# Patient Record
Sex: Female | Born: 1955 | Race: White | Hispanic: No | Marital: Married | Smoking: Former smoker
Health system: Southern US, Community
[De-identification: ages and names within clinical notes are randomized; demographics above are authoritative.]

## PROBLEM LIST (undated history)

## (undated) DIAGNOSIS — E785 Hyperlipidemia, unspecified: Secondary | ICD-10-CM

## (undated) DIAGNOSIS — K219 Gastro-esophageal reflux disease without esophagitis: Secondary | ICD-10-CM

## (undated) HISTORY — PX: ANKLE SURGERY: SHX546

## (undated) HISTORY — DX: Gastro-esophageal reflux disease without esophagitis: K21.9

## (undated) HISTORY — DX: Hyperlipidemia, unspecified: E78.5

---

## 1999-02-12 ENCOUNTER — Encounter: Admission: RE | Admit: 1999-02-12 | Discharge: 1999-02-12 | Payer: Self-pay | Admitting: Family Medicine

## 1999-03-19 ENCOUNTER — Encounter: Admission: RE | Admit: 1999-03-19 | Discharge: 1999-03-19 | Payer: Self-pay | Admitting: Family Medicine

## 1999-05-14 ENCOUNTER — Encounter: Admission: RE | Admit: 1999-05-14 | Discharge: 1999-05-14 | Payer: Self-pay | Admitting: Family Medicine

## 2000-04-12 ENCOUNTER — Encounter: Admission: RE | Admit: 2000-04-12 | Discharge: 2000-04-12 | Payer: Self-pay | Admitting: Family Medicine

## 2000-08-08 ENCOUNTER — Other Ambulatory Visit: Admission: RE | Admit: 2000-08-08 | Discharge: 2000-08-08 | Payer: Self-pay | Admitting: Gynecology

## 2000-10-01 ENCOUNTER — Emergency Department (HOSPITAL_COMMUNITY): Admission: EM | Admit: 2000-10-01 | Discharge: 2000-10-01 | Payer: Self-pay | Admitting: Emergency Medicine

## 2000-12-21 ENCOUNTER — Encounter: Payer: Self-pay | Admitting: *Deleted

## 2000-12-21 ENCOUNTER — Emergency Department (HOSPITAL_COMMUNITY): Admission: EM | Admit: 2000-12-21 | Discharge: 2000-12-21 | Payer: Self-pay | Admitting: Emergency Medicine

## 2000-12-22 ENCOUNTER — Encounter: Admission: RE | Admit: 2000-12-22 | Discharge: 2000-12-22 | Payer: Self-pay | Admitting: Family Medicine

## 2000-12-23 ENCOUNTER — Encounter: Admission: RE | Admit: 2000-12-23 | Discharge: 2000-12-23 | Payer: Self-pay | Admitting: Family Medicine

## 2001-01-05 ENCOUNTER — Encounter: Admission: RE | Admit: 2001-01-05 | Discharge: 2001-01-05 | Payer: Self-pay | Admitting: Family Medicine

## 2001-01-19 ENCOUNTER — Encounter: Admission: RE | Admit: 2001-01-19 | Discharge: 2001-01-19 | Payer: Self-pay | Admitting: Family Medicine

## 2001-01-19 ENCOUNTER — Encounter: Payer: Self-pay | Admitting: Family Medicine

## 2001-01-19 ENCOUNTER — Ambulatory Visit (HOSPITAL_COMMUNITY): Admission: RE | Admit: 2001-01-19 | Discharge: 2001-01-19 | Payer: Self-pay | Admitting: Family Medicine

## 2001-02-09 ENCOUNTER — Encounter: Admission: RE | Admit: 2001-02-09 | Discharge: 2001-02-09 | Payer: Self-pay | Admitting: Family Medicine

## 2001-06-01 ENCOUNTER — Encounter: Admission: RE | Admit: 2001-06-01 | Discharge: 2001-06-01 | Payer: Self-pay | Admitting: Sports Medicine

## 2001-06-06 ENCOUNTER — Encounter: Admission: RE | Admit: 2001-06-06 | Discharge: 2001-06-06 | Payer: Self-pay | Admitting: Family Medicine

## 2001-06-27 ENCOUNTER — Encounter: Admission: RE | Admit: 2001-06-27 | Discharge: 2001-06-27 | Payer: Self-pay | Admitting: Family Medicine

## 2001-10-10 ENCOUNTER — Encounter: Admission: RE | Admit: 2001-10-10 | Discharge: 2001-10-10 | Payer: Self-pay | Admitting: Family Medicine

## 2001-10-10 ENCOUNTER — Encounter: Payer: Self-pay | Admitting: Sports Medicine

## 2001-10-10 ENCOUNTER — Encounter: Admission: RE | Admit: 2001-10-10 | Discharge: 2001-10-10 | Payer: Self-pay | Admitting: Sports Medicine

## 2001-10-26 ENCOUNTER — Encounter: Admission: RE | Admit: 2001-10-26 | Discharge: 2001-10-26 | Payer: Self-pay | Admitting: Family Medicine

## 2002-01-29 ENCOUNTER — Other Ambulatory Visit: Admission: RE | Admit: 2002-01-29 | Discharge: 2002-01-29 | Payer: Self-pay | Admitting: Gynecology

## 2002-01-31 ENCOUNTER — Encounter (INDEPENDENT_AMBULATORY_CARE_PROVIDER_SITE_OTHER): Payer: Self-pay | Admitting: *Deleted

## 2002-01-31 LAB — CONVERTED CEMR LAB

## 2002-02-14 ENCOUNTER — Encounter: Admission: RE | Admit: 2002-02-14 | Discharge: 2002-02-14 | Payer: Self-pay | Admitting: Family Medicine

## 2002-02-15 ENCOUNTER — Encounter: Admission: RE | Admit: 2002-02-15 | Discharge: 2002-02-15 | Payer: Self-pay | Admitting: Family Medicine

## 2002-05-03 HISTORY — PX: UPPER GASTROINTESTINAL ENDOSCOPY: SHX188

## 2002-05-03 HISTORY — PX: COLONOSCOPY: SHX174

## 2002-06-05 ENCOUNTER — Encounter: Admission: RE | Admit: 2002-06-05 | Discharge: 2002-06-05 | Payer: Self-pay | Admitting: Family Medicine

## 2002-11-15 ENCOUNTER — Encounter: Admission: RE | Admit: 2002-11-15 | Discharge: 2002-11-15 | Payer: Self-pay | Admitting: Family Medicine

## 2003-02-01 ENCOUNTER — Encounter: Admission: RE | Admit: 2003-02-01 | Discharge: 2003-02-01 | Payer: Self-pay | Admitting: Family Medicine

## 2003-02-01 ENCOUNTER — Encounter: Admission: RE | Admit: 2003-02-01 | Discharge: 2003-02-01 | Payer: Self-pay | Admitting: Sports Medicine

## 2003-02-01 ENCOUNTER — Encounter: Payer: Self-pay | Admitting: Sports Medicine

## 2003-02-07 ENCOUNTER — Encounter: Admission: RE | Admit: 2003-02-07 | Discharge: 2003-02-07 | Payer: Self-pay | Admitting: Family Medicine

## 2003-04-18 ENCOUNTER — Ambulatory Visit (HOSPITAL_COMMUNITY): Admission: RE | Admit: 2003-04-18 | Discharge: 2003-04-18 | Payer: Self-pay | Admitting: Gastroenterology

## 2003-05-16 ENCOUNTER — Encounter: Admission: RE | Admit: 2003-05-16 | Discharge: 2003-05-16 | Payer: Self-pay | Admitting: Family Medicine

## 2003-11-05 ENCOUNTER — Encounter: Admission: RE | Admit: 2003-11-05 | Discharge: 2003-11-05 | Payer: Self-pay | Admitting: Sports Medicine

## 2004-04-09 ENCOUNTER — Ambulatory Visit: Payer: Self-pay | Admitting: Family Medicine

## 2004-05-18 ENCOUNTER — Inpatient Hospital Stay (HOSPITAL_COMMUNITY): Admission: EM | Admit: 2004-05-18 | Discharge: 2004-05-22 | Payer: Self-pay | Admitting: Emergency Medicine

## 2004-05-20 ENCOUNTER — Ambulatory Visit: Payer: Self-pay | Admitting: Physical Medicine & Rehabilitation

## 2004-10-27 ENCOUNTER — Ambulatory Visit: Payer: Self-pay | Admitting: Family Medicine

## 2005-04-08 ENCOUNTER — Ambulatory Visit: Payer: Self-pay | Admitting: Family Medicine

## 2005-05-03 HISTORY — PX: ABDOMINAL HYSTERECTOMY: SHX81

## 2005-09-09 ENCOUNTER — Ambulatory Visit (HOSPITAL_COMMUNITY): Admission: RE | Admit: 2005-09-09 | Discharge: 2005-09-09 | Payer: Self-pay | Admitting: Family Medicine

## 2005-09-09 ENCOUNTER — Ambulatory Visit: Payer: Self-pay | Admitting: Family Medicine

## 2005-11-11 ENCOUNTER — Ambulatory Visit: Payer: Self-pay | Admitting: Family Medicine

## 2005-12-14 ENCOUNTER — Ambulatory Visit: Payer: Self-pay | Admitting: Sports Medicine

## 2005-12-14 ENCOUNTER — Ambulatory Visit (HOSPITAL_COMMUNITY): Admission: RE | Admit: 2005-12-14 | Discharge: 2005-12-14 | Payer: Self-pay | Admitting: Sports Medicine

## 2005-12-23 ENCOUNTER — Ambulatory Visit: Payer: Self-pay | Admitting: Family Medicine

## 2006-06-30 DIAGNOSIS — K219 Gastro-esophageal reflux disease without esophagitis: Secondary | ICD-10-CM | POA: Insufficient documentation

## 2006-06-30 DIAGNOSIS — G44209 Tension-type headache, unspecified, not intractable: Secondary | ICD-10-CM

## 2006-06-30 DIAGNOSIS — F329 Major depressive disorder, single episode, unspecified: Secondary | ICD-10-CM

## 2006-06-30 DIAGNOSIS — E7800 Pure hypercholesterolemia, unspecified: Secondary | ICD-10-CM | POA: Insufficient documentation

## 2006-07-01 ENCOUNTER — Encounter (INDEPENDENT_AMBULATORY_CARE_PROVIDER_SITE_OTHER): Payer: Self-pay | Admitting: *Deleted

## 2006-08-09 ENCOUNTER — Telehealth: Payer: Self-pay | Admitting: Family Medicine

## 2006-08-18 ENCOUNTER — Ambulatory Visit: Payer: Self-pay | Admitting: Family Medicine

## 2007-02-08 ENCOUNTER — Telehealth: Payer: Self-pay | Admitting: *Deleted

## 2007-02-08 ENCOUNTER — Ambulatory Visit: Payer: Self-pay | Admitting: Family Medicine

## 2007-02-15 ENCOUNTER — Encounter: Payer: Self-pay | Admitting: Family Medicine

## 2007-05-10 ENCOUNTER — Telehealth (INDEPENDENT_AMBULATORY_CARE_PROVIDER_SITE_OTHER): Payer: Self-pay | Admitting: Family Medicine

## 2007-06-05 ENCOUNTER — Ambulatory Visit: Payer: Self-pay | Admitting: Family Medicine

## 2007-10-12 ENCOUNTER — Emergency Department (HOSPITAL_COMMUNITY): Admission: EM | Admit: 2007-10-12 | Discharge: 2007-10-12 | Payer: Self-pay | Admitting: Emergency Medicine

## 2008-03-18 ENCOUNTER — Telehealth: Payer: Self-pay | Admitting: *Deleted

## 2008-06-24 ENCOUNTER — Ambulatory Visit: Payer: Self-pay | Admitting: Family Medicine

## 2008-06-24 LAB — CONVERTED CEMR LAB

## 2008-06-25 ENCOUNTER — Ambulatory Visit: Payer: Self-pay | Admitting: Family Medicine

## 2008-06-25 LAB — CONVERTED CEMR LAB
Alkaline Phosphatase: 109 units/L (ref 39–117)
Glucose, Bld: 105 mg/dL — ABNORMAL HIGH (ref 70–99)
LDL Cholesterol: 117 mg/dL — ABNORMAL HIGH (ref 0–99)
Sodium: 140 meq/L (ref 135–145)
Total Bilirubin: 0.4 mg/dL (ref 0.3–1.2)
Total Protein: 7.5 g/dL (ref 6.0–8.3)
Triglycerides: 99 mg/dL (ref <150)
VLDL: 20 mg/dL (ref 0–40)

## 2008-06-26 ENCOUNTER — Encounter: Payer: Self-pay | Admitting: Family Medicine

## 2009-01-27 ENCOUNTER — Ambulatory Visit: Payer: Self-pay | Admitting: Family Medicine

## 2009-01-27 DIAGNOSIS — M79609 Pain in unspecified limb: Secondary | ICD-10-CM

## 2009-04-02 ENCOUNTER — Telehealth: Payer: Self-pay | Admitting: Family Medicine

## 2009-05-30 ENCOUNTER — Telehealth: Payer: Self-pay | Admitting: Family Medicine

## 2009-06-16 ENCOUNTER — Ambulatory Visit: Payer: Self-pay | Admitting: Family Medicine

## 2010-02-25 ENCOUNTER — Ambulatory Visit: Payer: Self-pay | Admitting: Family Medicine

## 2010-02-25 LAB — CONVERTED CEMR LAB
AST: 27 units/L (ref 0–37)
Albumin: 4.5 g/dL (ref 3.5–5.2)
Alkaline Phosphatase: 97 units/L (ref 39–117)
Bilirubin, Direct: 0.1 mg/dL (ref 0.0–0.3)
HDL: 55 mg/dL (ref >39)
Total Bilirubin: 0.5 mg/dL (ref 0.3–1.2)

## 2010-02-26 ENCOUNTER — Encounter: Payer: Self-pay | Admitting: Family Medicine

## 2010-05-24 ENCOUNTER — Encounter: Payer: Self-pay | Admitting: Orthopedic Surgery

## 2010-06-04 NOTE — Progress Notes (Signed)
Summary: meds prob  Phone Note Call from Patient Call back at [phone]   Caller: Patient Summary of Call: Kristi Evans is not working and is not sure what else she can take Initial call taken by: De Nurse,  May 30, 2009 11:36 AM  Follow-up for Phone Call        returned call, no answer Follow-up by: Gladstone Pih,  May 30, 2009 11:42 AM  Additional Follow-up for Phone Call Additional follow up Details #1::        tried to call again, no answer. Additional Follow-up by: Theresia Lo RN,  May 30, 2009 1:49 PM    Additional Follow-up for Phone Call Additional follow up Details #2::    c/o being sore & achy. she is at work & cannot come today. pain ranged from 5-7/10  assoon as she gets her schedule, she will call for an appt Follow-up by: Golden Circle RN,  June 02, 2009 9:41 AM

## 2010-06-04 NOTE — Letter (Signed)
Summary: Generic Letter  [facility]  [address]   [place], [place] [address]   Phone: [phone]  Fax: [phone]    02/26/2010  Kristi Evans [address] [place], [place]  [address]  Dear Ms. Cupps,  Your blood tests are good. Your LDL cholesterol went up a little from 117 last year to 120 this year but this is still ok.  Everything else was perfect.    Sincerely,   Pearlean Brownie MD  Appended Document: Generic Letter mailed.

## 2010-06-04 NOTE — Assessment & Plan Note (Signed)
Summary: f/u,df   Vital Signs:  Patient profile:   55 year old female Height:      63 inches Weight:      158 pounds BMI:     28.09 BSA:     1.75 Temp:     98.6 degrees F Pulse rate:   89 / minute BP sitting:   133 / 90  Vitals Entered By: Jone Baseman CMA (June 16, 2009 4:36 PM) CC: f/u Is Patient Diabetic? No Pain Assessment Patient in pain? yes     Location: hands Intensity: 5   CC:  f/u.  History of Present Illness: Current Problems:  HAND PAIN (ICD-729.5) still very painful in R hand at base of thumb.  No so much numbness as pain.  Does have tingling feeling when raises hand over head has had this for years with history of Thoracic outlet.  Does not wake her a at night.  Is better with tramadol Does have median nerve distribution tingling in Left hand at PM  TENSION HEADACHE (ICD-307.81)  also seems to happen with migraine symptoms of noise light intolerance.  her current regimen of rare 1/2 percocet and heat pack with amitryptylene prophylaxis is working.  No visual changes or loss of strenght  HYPERCHOLESTEROLEMIA (ICD-272.0) taking zocor without problems.  no chest pain no right upper quadrant pain  GASTROESOPHAGEAL REFLUX, NO ESOPHAGITIS (ICD-530.81) flaring using tums no bleeding or food sticking.  Not on PPI  DEPRESSIVE DISORDER, NOS (ICD-311) stable on amitryptylene  ROS - as above PMH - Medications reviewed and updated in medication list.  Smoking Status noted in VS form      Habits & Providers  Alcohol-Tobacco-Diet     Tobacco Status: never  Current Medications (verified): 1)  Amitriptyline Hcl 150 Mg Tabs (Amitriptyline Hcl) .Marland Kitchen.. 1 By Mouth At Bedtime 2)  Tramadol Hcl 50 Mg  Tabs (Tramadol Hcl) .... One- Two By Mouth Q6h As Needed Pain 3)  Zocor 40 Mg Tabs (Simvastatin) .... Take 1 Tablet By Mouth At Bedtime 4)  Percocet 5-325 Mg  Tabs (Oxycodone-Acetaminophen) .Marland Kitchen.. 1 As Needed For Severe Headaches.  No More Than 1 Daily 5)   Omeprazole 20 Mg Cpdr (Omeprazole) .Marland Kitchen.. 1 Daily For Heart Burn  Allergies: 1)  Codeine Phosphate (Codeine Phosphate) 2)  Aspirin (Aspirin)  Physical Exam  General:  Well-developed,well-nourished,in no acute distress; alert,appropriate and cooperative throughout examination Lungs:  Normal respiratory effort, chest expands symmetrically. Lungs are clear to auscultation, no crackles or wheezes. Heart:  Normal rate and regular rhythm. S1 and S2 normal without gallop, murmur, click, rub or other extra sounds. Extremities:  R hand tender over base of thumb.  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  Will check xray on R.  Consider splint for left  Orders: Diagnostic X-Ray/Fluoroscopy (Diagnostic X-Ray/Flu) [facility]- Est  Level 4 ([address])  Problem # 2:  TENSION HEADACHE (ICD-307.81)  also migraine components.  No red flags.  She is happy with current regimen Her updated medication list for this problem includes:    Tramadol Hcl 50 Mg Tabs (Tramadol hcl) ..... One- two by mouth q6h as needed pain    Percocet 5-325 Mg Tabs (Oxycodone-acetaminophen) .Marland Kitchen... 1 as needed for severe headaches.  no more than  1 daily  Orders: [facility]- Est  Level 4 ([address])  Problem # 3:  GASTROESOPHAGEAL REFLUX, NO ESOPHAGITIS (ICD-530.81) Assessment: Deteriorated No red flags but get back on PPI  Her updated medication list for this problem includes:    Omeprazole 20 Mg Cpdr (Omeprazole) .Marland Kitchen... 1 daily for heart burn  Problem # 4:  HYPERCHOLESTEROLEMIA (ICD-272.0) Assessment: Unchanged  Her updated medication list for this problem includes:    Zocor 40 Mg Tabs (Simvastatin) .Marland Kitchen... Take 1 tablet by mouth at bedtime  Orders: [facility]- Est  Level 4 ([address])Future Orders: Lipid-[facility] ([id number]) ...  06/03/2010 Hepatic-[facility] [id number]) ... 06/03/2010  Labs Reviewed: SGOT: 26 (06/25/2008)   SGPT: 36 (06/25/2008)   HDL:75 (06/25/2008)  LDL:117 (06/25/2008)  Chol:212 (06/25/2008)  Trig:99 (06/25/2008)  Complete Medication List: 1)  Amitriptyline Hcl 150 Mg Tabs (Amitriptyline hcl) .Marland Kitchen.. 1 by mouth at bedtime 2)  Tramadol Hcl 50 Mg Tabs (Tramadol hcl) .... One- two by mouth q6h as needed pain 3)  Zocor 40 Mg Tabs (Simvastatin) .... Take 1 tablet by mouth at bedtime 4)  Percocet 5-325 Mg Tabs (Oxycodone-acetaminophen) .Marland Kitchen.. 1 as needed for severe headaches.  no more than 1 daily 5)  Omeprazole 20 Mg Cpdr (Omeprazole) .Marland Kitchen.. 1 daily for heart burn  Patient Instructions: 1)  Please schedule a follow-up appointment in 6 months .  2)  Use tramadol and ice for pain I will call you with the xray results 3)  Try the omeprazole for hearburn call me if getting severe or any bleeding  4)  Fasting choleserol blood test call the day before 5)  It is important that you exercise reguarly at least 20 minutes 5 times a week. If you develop chest pain, have severe difficulty breathing, or feel very tired, stop exercising immediately and seek medical attention.  6)  Schedule your mammogram.  Prescriptions: ZOCOR 40 MG TABS (SIMVASTATIN) Take 1 tablet by mouth at bedtime  #30 x 11   Entered and Authorized by:   Pearlean Brownie MD   Signed by:   Pearlean Brownie MD on 06/16/2009   Method used:   Electronically to        [facility]  [address] [phone]* (retail)       [address]       [place], [place]  [address]       Ph: [phone] or [phone]       Fax: [phone]   RxID:   [id number] TRAMADOL HCL 50 MG  TABS (TRAMADOL HCL) one- two by mouth q6h as needed pain  #60 x 6   Entered and Authorized by:   Pearlean Brownie MD   Signed by:   Pearlean Brownie MD on 06/16/2009   Method used:   [facility] to        [facility]  [address] [phone]* (retail)        [address]       [place], [place]  [address]       Ph: [phone] or [phone]       Fax: [phone]   RxID:   [id number] OMEPRAZOLE 20 MG CPDR (OMEPRAZOLE) 1 daily for heart burn  #30 x 6   Entered and Authorized by:   Pearlean Brownie MD   Signed by:   Pearlean Brownie MD on 06/16/2009   Method used:   [facility]        [facility]  [address] [phone]* (retail)       [address]       [place], [place]  [address]       Ph: [phone] or [phone]       Fax: [phone]   RxID:   [id number]    Prevention & Chronic Care Immunizations   Influenza vaccine: Not documented    Tetanus booster: Not documented    Pneumococcal vaccine: Not documented  Colorectal Screening   Hemoccult: Not documented   Hemoccult due: Not Indicated    Colonoscopy: normal  (03/04/2003)   Colonoscopy due: 03/03/2013  Other Screening   Pap smear: Hysterectomy in distant past  (06/24/2008)   Pap smear due: Not Indicated    Mammogram: Normal  (02/23/2007)   Mammogram due: 03/2008   Smoking status: never  (06/16/2009)  Lipids   Total Cholesterol: 212  (06/25/2008)   LDL: 117  (06/25/2008)   LDL Direct: Not documented   HDL: 75  (06/25/2008)   Triglycerides: 99  (06/25/2008)    SGOT (AST): 26  (06/25/2008)   SGPT (ALT): 36  (06/25/2008)   Alkaline phosphatase: 109  (06/25/2008)   Total bilirubin: 0.4  (06/25/2008)    Lipid flowsheet reviewed?: Yes   Progress toward LDL goal: Unchanged  Self-Management Support :   Personal Goals (by the next clinic visit) :      Personal LDL goal: 130  (01/27/2009)    Lipid self-management support: Not documented

## 2010-06-04 NOTE — Assessment & Plan Note (Signed)
Summary: cough/dizziness,tcb   Vital Signs:  Patient profile:   55 year old female Height:      63 inches Weight:      160.38 pounds BMI:     28.51 BSA:     1.76 O2 Sat:      98 % on Room air Temp:     98.4 degrees F Pulse rate:   108 / minute BP sitting:   127 / 81  Vitals Entered By: Jone Baseman CMA (February 25, 2010 9:23 AM)  O2 Flow:  Room air CC: cough adn dizziness x 2 weeks Is Patient Diabetic? No Pain Assessment Patient in pain? yes     Location: left side of back Intensity: 6   CC:  cough adn dizziness x 2 weeks.  History of Present Illness: cough for the last few weeks.  Slowly impoving.  Everyone in family had illness.  No fever or chills or sputum.  Has had left sided chest pain with cough or movement started yesterday is better today. No shortness of breath with exertion or leg swelling.   Tried mucinex once  Headaches continue as usual.  No changes.  No focal weakness or vision changes.  Usually contolled with tramadol.  Rarely uses 1/2 of a percocet.  Cholesterol taking zocor daily.  No right upper quadrant pain or muscle complaints.  No chest pain   Hand pain continues unchanged.  Numb more on the left and pain at base of both thumbs.  ROS - as above PMH - Medications reviewed and updated in medication list.  Smoking Status noted in VS form    Habits & Providers  Alcohol-Tobacco-Diet     Tobacco Status: never     Year Quit: 1998  Current Medications (verified): 1)  Amitriptyline Hcl 150 Mg Tabs (Amitriptyline Hcl) .Marland Kitchen.. 1 By Mouth At Bedtime 2)  Tramadol Hcl 50 Mg  Tabs (Tramadol Hcl) .... One- Two By Mouth Q6h As Needed Pain 3)  Zocor 40 Mg Tabs (Simvastatin) .... Take 1 Tablet By Mouth At Bedtime 4)  Percocet 5-325 Mg  Tabs (Oxycodone-Acetaminophen) .Marland Kitchen.. 1 As Needed For Severe Headaches.  No More Than 1 Daily 5)  Omeprazole 20 Mg Cpdr (Omeprazole) .Marland Kitchen.. 1 Daily For Heart Burn  Allergies: 1)  Codeine Phosphate (Codeine Phosphate) 2)   Aspirin (Aspirin)  Physical Exam  General:  Well-developed,well-nourished,in no acute distress; alert,appropriate and cooperative throughout examination Lungs:  Normal respiratory effort, chest expands symmetrically. Lungs are clear to auscultation, no crackles or wheezes. Heart:  Normal rate and regular rhythm. S1 and S2 normal without gallop, murmur, click, rub or other extra sounds. Msk:  Back tender over lateral mid left flank.  No deformity.  No radiation  Hands mild deformity of hand joints diffusely, No actve redness or warmth. Tender a base of both thumbs.   No loss of strength or muscle mass    Impression & Recommendations:  Problem # 1:  COUGH (ICD-786.2) post viral without signs of pneumonia or COPD. Try cough suppressant and follow to resolution  Orders: [facility]- Est  Level 4 ([address])  Problem # 2:  HAND PAIN (ICD-729.5) DJD.   continue analgesics. Also likely has carpal tunnel on the Left with her numbness symptoms. Has previoulsy had release on the R.  To consider steroid injection if is severe   Problem # 3:  HYPERCHOLESTEROLEMIA (ICD-272.0) check labs today  Her updated medication list for this problem includes:    Zocor 40 Mg Tabs (Simvastatin) .Marland Kitchen... Take 1 tablet  by mouth at bedtime  Orders: Lipid-[facility] ([id number]) Hepatic-[facility] [phone]) [facility]- Est  Level 4 ([address])  Labs Reviewed: SGOT: 26 (06/25/2008)   SGPT: 36 (06/25/2008)   HDL:75 (06/25/2008)  LDL:117 (06/25/2008)  Chol:212 (06/25/2008)  Trig:99 (06/25/2008)  Problem # 4:  TENSION HEADACHE (ICD-307.81) Assessment: Unchanged stable with current regimen Her updated medication list for this problem includes:    Tramadol Hcl 50 Mg Tabs (Tramadol hcl) ..... One- two by mouth q6h as needed pain    Percocet 5-325 Mg Tabs (Oxycodone-acetaminophen) .Marland Kitchen... 1 as needed for severe headaches.  no more than 1 daily  Orders: [facility]- Est  Level 4 ([address])  Complete Medication List: 1)  Amitriptyline Hcl 150 Mg Tabs  (Amitriptyline hcl) .Marland Kitchen.. 1 by mouth at bedtime 2)  Tramadol Hcl 50 Mg Tabs (Tramadol hcl) .... One- two by mouth q6h as needed pain 3)  Zocor 40 Mg Tabs (Simvastatin) .... Take 1 tablet by mouth at bedtime 4)  Percocet 5-325 Mg Tabs (Oxycodone-acetaminophen) .Marland Kitchen.. 1 as needed for severe headaches.  no more than 1 daily 5)  Omeprazole 20 Mg Cpdr (Omeprazole) .Marland Kitchen.. 1 daily for heart burn 6)  Tessalon 200 Mg Caps (Benzonatate) .Marland Kitchen.. 1 by mouth three times a day as needed  Other Orders: Flu Vaccine 26yrs + ([address]) Admin 1st Vaccine ([address])  Patient Instructions: 1)  Call if your cough is not gone in 3 weeks or if you get fever or sputum 2)  Schedule your mammogram.  3)  I will call you if your lab is abnormal otherwise I will send you a letter within 2 weeks. Prescriptions: TESSALON 200 MG CAPS (BENZONATATE) 1 by mouth three times a day as needed  #30 x 1   Entered and Authorized by:   Pearlean Brownie MD   Signed by:   Pearlean Brownie MD on 02/25/2010   Method used:   [facility]        [facility] [address] [phone]* (retail)       [address]       [place], [place]  [address]       Ph: [phone] or [phone]       Fax: [phone]   RxID:   [id number] PERCOCET 5-325 MG  TABS (OXYCODONE-ACETAMINOPHEN) 1 as needed for severe headaches.  No more than 1 daily  #300 x 0   Entered and Authorized by:   Pearlean Brownie MD   Signed by:   Pearlean Brownie MD on 02/25/2010   Method used:   Handwritten   RxID:   [id number]    Orders Added: 1)  Lipid-[facility] [[id number]] 2)  Hepatic-[facility] [[id number]] 3)  Flu Vaccine 35yrs + [[address]] 4)  Admin 1st Vaccine [[address]71] 5)  [facility]- Est  Level 4 [[address]]   Immunizations Administered:  Influenza Vaccine # 1:    Vaccine Type: Fluvax 3+    Site: right deltoid    Mfr: GlaxoSmithKline    Dose: 0.25 ml    Route: IM    Given by: Jone Baseman CMA    Exp. Date: 10/28/2010    Lot #: [id number]     VIS given: 11/25/09 version given February 25, 2010.  Flu Vaccine Consent Questions:    Do you have a history of severe allergic reactions to this vaccine? no    Any prior history of allergic reactions to egg and/or gelatin? no    Do you have a sensitivity to the preservative Thimersol? no  Do you have a past history of Guillan-Barre Syndrome? no    Do you currently have an acute febrile illness? no    Have you ever had a severe reaction to latex? no    Vaccine information given and explained to patient? yes    Are you currently pregnant? no   Immunizations Administered:  Influenza Vaccine # 1:    Vaccine Type: Fluvax 3+    Site: right deltoid    Mfr: GlaxoSmithKline    Dose: 0.25 ml    Route: IM    Given by: Jone Baseman CMA    Exp. Date: 10/28/2010    Lot #: [id number]    VIS given: 11/25/09 version given February 25, 2010.   Prevention & Chronic Care Immunizations   Influenza vaccine: Fluvax 3+  (02/25/2010)    Tetanus booster: Not documented    Pneumococcal vaccine: Not documented  Colorectal Screening   Hemoccult: Not documented   Hemoccult due: Not Indicated    Colonoscopy: normal  (03/04/2003)   Colonoscopy due: 03/03/2013  Other Screening   Pap smear: Hysterectomy in distant past  (06/24/2008)   Pap smear due: Not Indicated    Mammogram: Normal  (02/23/2007)   Mammogram due: 03/2008   Smoking status: never  (02/25/2010)  Lipids   Total Cholesterol: 212  (06/25/2008)   LDL: 117  (06/25/2008)   LDL Direct: Not documented   HDL: 75  (06/25/2008)   Triglycerides: 99  (06/25/2008)    SGOT (AST): 26  (06/25/2008)   SGPT (ALT): 36  (06/25/2008)   Alkaline phosphatase: 109  (06/25/2008)   Total bilirubin: 0.4  (06/25/2008)  Self-Management Support :   Personal Goals (by the next clinic visit) :      Personal LDL goal: 130  (01/27/2009)    Lipid self-management support: Not documented

## 2010-06-12 ENCOUNTER — Other Ambulatory Visit: Payer: Self-pay | Admitting: Family Medicine

## 2010-06-12 NOTE — Telephone Encounter (Signed)
Refill request

## 2010-08-04 ENCOUNTER — Other Ambulatory Visit: Payer: Self-pay | Admitting: Family Medicine

## 2010-08-04 DIAGNOSIS — F329 Major depressive disorder, single episode, unspecified: Secondary | ICD-10-CM

## 2010-08-04 NOTE — Telephone Encounter (Signed)
Refill request

## 2010-08-09 ENCOUNTER — Other Ambulatory Visit: Payer: Self-pay | Admitting: Family Medicine

## 2010-08-09 NOTE — Telephone Encounter (Signed)
Refill request

## 2010-08-17 ENCOUNTER — Ambulatory Visit (INDEPENDENT_AMBULATORY_CARE_PROVIDER_SITE_OTHER): Payer: 59 | Admitting: Family Medicine

## 2010-08-17 ENCOUNTER — Encounter: Payer: Self-pay | Admitting: Family Medicine

## 2010-08-17 DIAGNOSIS — M79609 Pain in unspecified limb: Secondary | ICD-10-CM

## 2010-08-17 DIAGNOSIS — E7800 Pure hypercholesterolemia, unspecified: Secondary | ICD-10-CM

## 2010-08-17 DIAGNOSIS — K219 Gastro-esophageal reflux disease without esophagitis: Secondary | ICD-10-CM

## 2010-08-17 DIAGNOSIS — G44209 Tension-type headache, unspecified, not intractable: Secondary | ICD-10-CM

## 2010-08-17 MED ORDER — TRAMADOL HCL 50 MG PO TABS: 50.0000 mg | ORAL_TABLET | Freq: Four times a day (QID) | ORAL | Status: DC | PRN

## 2010-08-17 MED ORDER — OMEPRAZOLE 20 MG PO CPDR: 20.0000 mg | DELAYED_RELEASE_CAPSULE | Freq: Every day | ORAL | Status: DC

## 2010-08-17 NOTE — Progress Notes (Signed)
  Subjective:    Patient ID: Kristi Evans, female    DOB: 07-Apr-1956, 55 y.o.   MRN: [id number]  HPI  GERD continues to have episodes of meat sticking and also episodes of reflux when she will regurgitate a large part of her meals this is been worsened over the last 2 days, but she's had a GI bug. She does not have any weight loss or bleeding. she takes omeprazole daily which does seem to help  Hand pain  continues to have bilateral hand pain pain seems to be mostly in the medial nerve distribution she awakes at night and has to shake her hands to get them to wake up she has not lost any strength does not seem to be dropping things regularly she had an carpal tunnel release on the right a number of years ago.   Elevated cholesterol She is taking on simvastatin daily without any right upper quadrant pain or muscle aches no chest pain with exertion or any claudication symptoms  Tension headaches Continues to have intermittent tension headaches are similar to the one she said in the past she takes tramadol when these are mild or occasional Percocet when they are severe no visual changes or any focal weakness   Past medical history With previous carpal tunnel release surgery she had a good result She saw Dr. Katheren Puller before for her GERD and had an endoscopy a number of years ago  Review of Systems see above    Objective:   Physical Exam   Hands good grip strength bilaterally no muscle weight wasting sensation is grossly intact to light touch Abdomen no masses hepatosplenomegaly guarding or rebound Extremities no edema cyanosis or clubbing no joint deformity    Assessment & Plan:

## 2010-08-17 NOTE — Assessment & Plan Note (Signed)
Consistent with bilateral carpal tunnel syndrome. We discussed possible approaches including injection and referral for carpal tunnel release she will continue to monitor her symptoms and let me know if they are worsening. she will try night splints

## 2010-08-17 NOTE — Assessment & Plan Note (Signed)
Stable with current regimen of when necessary analgesics and amitriptyline

## 2010-08-17 NOTE — Assessment & Plan Note (Signed)
Unsure whether this is slightly worsened or not she has chronic symptoms for years. We decided that she would monitor her symptoms closely for the next several weeks and if she feels they are getting worse meaning more regurgitation or food sticking that she should give me a call and we will refer her for re\ endoscopy with Dr. Jarold Motto.  Currently there are no specific red flag symptoms of bleeding or weight loss or severe dysphagia

## 2010-08-17 NOTE — Assessment & Plan Note (Signed)
Stable on simvastatin encouraged continued increase in fruits and vegetables and exercise

## 2010-08-17 NOTE — Patient Instructions (Signed)
Call me if the reflux food coming back is getting worse Call me if you would like a referral for the carpal tunnel Keep up the fish oil and the exercise Let me know if the masses are getting bigger

## 2010-08-18 ENCOUNTER — Encounter: Payer: Self-pay | Admitting: Family Medicine

## 2010-09-18 NOTE — Discharge Summary (Signed)
NAMERACHELL, DRUCKENMILLER              ACCOUNT NO.:  [id number]   MEDICAL RECORD NO.:  [id number]          PATIENT TYPE:  INP   LOCATION:  5035                         FACILITY:  [facility]   PHYSICIAN:  Burnard Bunting, M.D.    DATE OF BIRTH:  December 01, 1955   DATE OF ADMISSION:  05/19/2004  DATE OF DISCHARGE:  05/22/2004                                 DISCHARGE SUMMARY   PREOPERATIVE DIAGNOSIS:  Left ankle pilon fracture.   POSTOPERATIVE DIAGNOSIS:  Left ankle pilon fracture.   SECONDARY DIAGNOSIS:  None.   PROCEDURE:  Left ankle pilon fracture, open reduction internal fixation  performed May 19, 2004.   HOSPITAL COURSE:  Kristi Evans is a 55 year old patient who sustained a  left ankle pilon and lateral malleolus fracture on May 19, 2004.  She  underwent left ankle open reduction, internal fixation pilon fracture.  Toes  were mobile, perfused, and sensate on postop day #1.  She was mobilized with  physical therapy.  Compartments were soft.  She was started on Coumadin for  DVT prophylaxis.  The patient was slow to mobilized.  It was difficult for  her to maintain a nonweightbearing status.   She was discharged home on May 22, 2004, in good condition.   DISCHARGE MEDICATIONS:  1.  Percocet one to two p.o. q.3-4h. p.r.n. pain.  2.  Robaxin.  3.  Coumadin.   FOLLOWUP:  She will follow up with me in seven days for suture removal.  Continue nonweightbearing status.      GSD/MEDQ  D:  07/10/2004  T:  07/12/2004  Job:  [id number]

## 2010-09-18 NOTE — Op Note (Signed)
NAMEDOLORA, Kristi Evans              ACCOUNT NO.:  [id number]   MEDICAL RECORD NO.:  [id number]          PATIENT TYPE:  INP   LOCATION:  5035                         FACILITY:  [facility]   PHYSICIAN:  Burnard Bunting, M.D.    DATE OF BIRTH:  [birth year]   DATE OF PROCEDURE:  05/19/2004  DATE OF DISCHARGE:                                 OPERATIVE REPORT   PREOPERATIVE DIAGNOSIS:  Left ankle pilon fracture and distal fibular  fracture.   POSTOPERATIVE DIAGNOSIS:  Left ankle pilon fracture and distal fibular  fracture.   PROCEDURE:  Left ankle open reduction/internal fixation of pilon fracture  with manipulation of distal fibular fracture.   SURGEON:  Burnard Bunting, M.D.   ASSISTANT:  Vear Clock,  M.D..   ANESTHESIA:  General endotracheal.   ESTIMATED BLOOD LOSS:  50 cc.   DRAINS:  None.   PROCEDURE IN DETAIL:  Patient was brought to the operating room, where  general endotracheal anesthesia was induced.  Preoperative antibiotics were  administered.  The left ankle was then prepped with Duraprep solution and  draped in a sterile manner.  An ankle esmarch was utilized for approximately  an hour and a half.  Medial incision was made over the medial malleolus.  Skin and subcutaneous tissue were sharply divided.  A large medial malleolar  fragment was exposed.  The periosteum was elevated only from around the  fracture site.  The distal tibial articular surface had been impacted and  depressed on the medial aspect of the plafond.  This articular surface was  exposed and tapped back down to the anatomic position.  After irrigation,  allograft crouton bone grafting was applied to support the articular  surface.  The medial malleolar fracture segment was then reattached to the  tibial shaft using two 4-5 cannulated partially threaded screws and one 4-0  cannulated screw to support the articular reconstruction.  At this time, the  lateral ankle reduction was anatomic.  Manipulation was  performed in order  to make the reduction anatomic.  The reduction was confirmed in the AP and  lateral planes under fluoroscopic guidance.  The medial incision was then  irrigated and closed using interrupted inverted 2-0 Vicryl and 3-0 nylon.  The patient was placed in a bulky posterior splint.  She tolerated the  procedure well without any complications.       GSD/MEDQ  D:  05/19/2004  T:  05/19/2004  Job:  [id number]

## 2011-05-06 ENCOUNTER — Encounter: Payer: Self-pay | Admitting: Family Medicine

## 2011-05-06 ENCOUNTER — Ambulatory Visit (INDEPENDENT_AMBULATORY_CARE_PROVIDER_SITE_OTHER): Payer: 59 | Admitting: Family Medicine

## 2011-05-06 VITALS — BP 136/82 | HR 88 | Temp 98.7°F | Ht 61.0 in | Wt 164.0 lb

## 2011-05-06 DIAGNOSIS — R059 Cough, unspecified: Secondary | ICD-10-CM | POA: Insufficient documentation

## 2011-05-06 DIAGNOSIS — R05 Cough: Secondary | ICD-10-CM

## 2011-05-06 DIAGNOSIS — J069 Acute upper respiratory infection, unspecified: Secondary | ICD-10-CM

## 2011-05-06 MED ORDER — BENZONATATE 100 MG PO CAPS: 100.0000 mg | ORAL_CAPSULE | Freq: Two times a day (BID) | ORAL | Status: AC | PRN

## 2011-05-06 NOTE — Patient Instructions (Signed)
It was nice meeting you today. I think your symptoms are likely due to a virus I would continue the mucinex, drink plenty of fluids, and take the prescribed medication for your cough If not getting better by next week please give our office a call.

## 2011-05-09 DIAGNOSIS — J069 Acute upper respiratory infection, unspecified: Secondary | ICD-10-CM | POA: Insufficient documentation

## 2011-05-09 NOTE — Assessment & Plan Note (Signed)
Symptoms consistent with viral process.  Symptomatic therapy suggested: push fluids, rest and return office visit prn if symptoms persist or worsen. Lack of antibiotic effectiveness discussed with her. Call or return to clinic prn if these symptoms worsen or fail to improve as anticipated.

## 2011-05-09 NOTE — Progress Notes (Signed)
Patient ID: Kristi Evans, female   DOB: 1955-06-26, 56 y.o.   MRN: [id number] SUBJECTIVE:  Kristi Evans is a 56 y.o. female who complains of coryza, congestion and dry cough for 7 days. She denies a history of chills, fevers, myalgias, nausea, shortness of breath, vomiting, wheezing and sputum production and denies a history of asthma. Patient does not smoke cigarettes.  Feels like symptoms are improving some.     OBJECTIVE: Filed Vitals:   05/06/11 0852  BP: 136/82  Pulse: 88  Temp: 98.7 F (37.1 C)    GEN: She appears well, vital signs are as noted.  HEENT: Ears normal.  Throat and pharynx normal.  Neck supple. No adenopathy in the neck. Nose is congested. Sinuses non tender. Pulm: The chest is clear, without wheezes or rales.

## 2011-06-24 ENCOUNTER — Other Ambulatory Visit: Payer: Self-pay | Admitting: Family Medicine

## 2011-06-24 NOTE — Telephone Encounter (Signed)
Refill request

## 2011-06-29 ENCOUNTER — Ambulatory Visit (INDEPENDENT_AMBULATORY_CARE_PROVIDER_SITE_OTHER): Payer: 59 | Admitting: Family Medicine

## 2011-07-06 ENCOUNTER — Ambulatory Visit (INDEPENDENT_AMBULATORY_CARE_PROVIDER_SITE_OTHER): Payer: 59 | Admitting: Family Medicine

## 2011-07-06 ENCOUNTER — Encounter: Payer: Self-pay | Admitting: Family Medicine

## 2011-07-06 VITALS — BP 114/80 | HR 92 | Temp 98.8°F | Wt 167.0 lb

## 2011-07-06 DIAGNOSIS — Z23 Encounter for immunization: Secondary | ICD-10-CM

## 2011-07-06 DIAGNOSIS — E7800 Pure hypercholesterolemia, unspecified: Secondary | ICD-10-CM

## 2011-07-06 DIAGNOSIS — K219 Gastro-esophageal reflux disease without esophagitis: Secondary | ICD-10-CM

## 2011-07-06 DIAGNOSIS — G44209 Tension-type headache, unspecified, not intractable: Secondary | ICD-10-CM

## 2011-07-06 LAB — COMPREHENSIVE METABOLIC PANEL
ALT: 43 U/L — ABNORMAL HIGH (ref 0–35)
CO2: 25 mEq/L (ref 19–32)
Calcium: 9.9 mg/dL (ref 8.4–10.5)
Chloride: 104 mEq/L (ref 96–112)
Potassium: 4.5 mEq/L (ref 3.5–5.3)
Sodium: 139 mEq/L (ref 135–145)
Total Protein: 7.1 g/dL (ref 6.0–8.3)

## 2011-07-06 LAB — CBC
Platelets: 275 10*3/uL (ref 150–400)
RBC: 4.46 MIL/uL (ref 3.87–5.11)
WBC: 7.1 10*3/uL (ref 4.0–10.5)

## 2011-07-06 LAB — LIPID PANEL
LDL Cholesterol: 109 mg/dL — ABNORMAL HIGH (ref 0–99)
Total CHOL/HDL Ratio: 3.1 Ratio
VLDL: 28 mg/dL (ref 0–40)

## 2011-07-06 MED ORDER — OXYCODONE-ACETAMINOPHEN 5-325 MG PO TABS: 1.0000 | ORAL_TABLET | Freq: Every day | ORAL | Status: DC

## 2011-07-06 MED ORDER — TRAMADOL HCL 50 MG PO TABS: 50.0000 mg | ORAL_TABLET | Freq: Four times a day (QID) | ORAL | Status: DC | PRN

## 2011-07-06 MED ORDER — AMITRIPTYLINE HCL 150 MG PO TABS: 150.0000 mg | ORAL_TABLET | Freq: Every day | ORAL | Status: DC

## 2011-07-06 MED ORDER — SIMVASTATIN 40 MG PO TABS: 40.0000 mg | ORAL_TABLET | Freq: Every day | ORAL | Status: DC

## 2011-07-06 MED ORDER — OMEPRAZOLE 20 MG PO CPDR: 20.0000 mg | DELAYED_RELEASE_CAPSULE | Freq: Two times a day (BID) | ORAL | Status: DC

## 2011-07-06 NOTE — Assessment & Plan Note (Signed)
Worsening of her chronic condiition.  No new symptoms and had EGD a few years ago.  Will increase PPI.  If not resolving will next [place] to rule out gallstones and may need reendoscopy

## 2011-07-06 NOTE — Assessment & Plan Note (Signed)
Check labs.  Seems to be tolerating statin well

## 2011-07-06 NOTE — Patient Instructions (Signed)
If the reflux and stomach pain is not better after taking twice daily omeprazole for 2 weeks then call   I will call you if your tests are not good.  Otherwise I will send you a letter.  If you do not hear from me with in 2 weeks please call our office.

## 2011-07-06 NOTE — Progress Notes (Signed)
  Subjective:    Patient ID: Kristi Evans, female    DOB: 01-24-1956, 56 y.o.   MRN: [id number]  HPI  GERD Having more reflux and epigastric pain similar to episodes she has had in the past.  Frequent regurgitation that she has also had for years.   No bleeding or melena or lightheadness or chest pain.  Using Prilosec once daily and tums which help some. PMH - had EGD around 2005 was normal  Headaches About the same as usual.  Infrequent pounding headaches that usually respond to tramadol but rarely require Percocet.  No focal neurological symptoms no visual changes.  Cholesterol Taking simvastatin regularly.  No muscle aches or right upper quadrant pain.  No chest pain or claudication  Review of Symptoms - see HPI  PMH - Smoking status noted.     Review of Systems     Objective:   Physical Exam  Heart - Regular rate and rhythm.  No murmurs, gallops or rubs.    Lungs:  Normal respiratory effort, chest expands symmetrically. Lungs are clear to auscultation, no crackles or wheezes. Extremities:  No cyanosis, edema, or deformity noted with good range of motion of all major joints.   PERRL EOMI Abdomen - mild epigastric pain without masses or organomegaly       Assessment & Plan:

## 2011-07-06 NOTE — Assessment & Plan Note (Signed)
Stable chronic condition without signs of intracranial disease.  Continue as needed analgesics.  She is using them responsibly

## 2011-07-07 ENCOUNTER — Encounter: Payer: Self-pay | Admitting: Family Medicine

## 2011-07-08 ENCOUNTER — Telehealth: Payer: Self-pay

## 2011-07-08 NOTE — Telephone Encounter (Signed)
Please confirm receipt of fax for insurance purposes  [phone] Claim number [id number]

## 2011-08-26 ENCOUNTER — Other Ambulatory Visit: Payer: Self-pay | Admitting: Family Medicine

## 2011-08-27 ENCOUNTER — Other Ambulatory Visit: Payer: Self-pay | Admitting: Family Medicine

## 2011-12-16 ENCOUNTER — Other Ambulatory Visit: Payer: Self-pay | Admitting: Family Medicine

## 2012-02-14 ENCOUNTER — Ambulatory Visit (INDEPENDENT_AMBULATORY_CARE_PROVIDER_SITE_OTHER): Payer: 59 | Admitting: Family Medicine

## 2012-02-14 ENCOUNTER — Encounter: Payer: Self-pay | Admitting: Family Medicine

## 2012-02-14 VITALS — BP 106/71 | HR 93 | Temp 98.3°F | Ht 61.0 in | Wt 160.0 lb

## 2012-02-14 DIAGNOSIS — Z23 Encounter for immunization: Secondary | ICD-10-CM

## 2012-02-14 DIAGNOSIS — K219 Gastro-esophageal reflux disease without esophagitis: Secondary | ICD-10-CM

## 2012-02-14 DIAGNOSIS — G44209 Tension-type headache, unspecified, not intractable: Secondary | ICD-10-CM

## 2012-02-14 DIAGNOSIS — R42 Dizziness and giddiness: Secondary | ICD-10-CM | POA: Insufficient documentation

## 2012-02-14 LAB — CBC
MCHC: 34.4 g/dL (ref 30.0–36.0)
Platelets: 271 10*3/uL (ref 150–400)
RDW: 12.8 % (ref 11.5–15.5)
WBC: 7 10*3/uL (ref 4.0–10.5)

## 2012-02-14 MED ORDER — OXYCODONE-ACETAMINOPHEN 5-325 MG PO TABS: 1.0000 | ORAL_TABLET | Freq: Every day | ORAL | Status: DC

## 2012-02-14 NOTE — Assessment & Plan Note (Signed)
Worsening with episodes of spitting up.   No signs of anemia or weight loss.  Will check labs and follow closely

## 2012-02-14 NOTE — Progress Notes (Signed)
  Subjective:    Patient ID: Kristi Evans, female    DOB: June 21, 1955, 56 y.o.   MRN: [id number]  HPI  Headaches Her usual migraine type headache but more frequent.  Taking analgesic several times a week.  No weakness or visual changes  Dizzyness Brief episodes of feeling unsteady.  No vertigo or visual changes or syncope or weakness.  Seems worse with stress and inactivty.  No hearing loss or fullness.     GERD Episodes of spitting up when eating.  Taking PPI twice daily No bleeding   Patient Information Form: Screening and ROS   Review of Symptoms  General:  Negative for nexplained weight loss, fever Skin: Negative for new or changing mole, sore that won't heal HEENT: Negative for trouble hearing, trouble seeing, ringing in ears, mouth sores, hoarseness, change in voice, dysphagia. CV:  Negative for chest pain, dyspnea, edema, palpitations Resp: Negative for cough, dyspnea, hemoptysis GI: No, abdominal pain, melena, hematochezia. GU: Negative for dysuria, incontinence, urinary hesitance, hematuria, vaginal or penile discharge, polyuria, sexual difficulty, lumps in testicle or breasts MSK: Negative for muscle cramps or aches, joint pain or swelling Neuro: Negative for, weakness, numbness, , passing out/fainting Psych: Negative for depression, anxiety, memory problems    Review of Systems     Objective:   Physical Exam  Heart - Regular rate and rhythm.  No murmurs, gallops or rubs.    Lungs:  Normal respiratory effort, chest expands symmetrically. Lungs are clear to auscultation, no crackles or wheezes. Neck:  No deformities, thyromegaly, masses, or tenderness noted.   Supple with full range of motion without pain. Extremities:  No cyanosis, edema, or deformity noted with good range of motion of all major joints.   Skin:  Intact without suspicious lesions or rashes Eye - Pupils Equal Round Reactive to light, Extraocular movements intact, Fundi without hemorrhage or visible  lesions, Conjunctiva without redness or discharge Neurologic exam : Cn 2-7 intact Strength equal & normal in upper & lower extremities Able to walk on heels and toes.   Balance normal  Romberg normal, finger to nose         Assessment & Plan:

## 2012-02-14 NOTE — Patient Instructions (Addendum)
Walk every day for at least 20-30 minutes  I will call you if your lab tests are not normal.  Otherwise we will discuss them at your next visit.  Come back in 1 month  Keep a headache list - when you have them and what you take

## 2012-02-14 NOTE — Assessment & Plan Note (Signed)
Worsening.  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  No signs of cns disease.  Recommend trying to reduce stress and judicious use of analgesics

## 2012-02-14 NOTE — Assessment & Plan Note (Signed)
Unsure of cause no signs of inner ear or cns disease.  Will monitor how responds to exercise

## 2012-02-16 ENCOUNTER — Encounter: Payer: 59 | Admitting: Family Medicine

## 2012-03-13 ENCOUNTER — Encounter: Payer: Self-pay | Admitting: Family Medicine

## 2012-03-13 ENCOUNTER — Ambulatory Visit (INDEPENDENT_AMBULATORY_CARE_PROVIDER_SITE_OTHER): Payer: 59 | Admitting: Family Medicine

## 2012-03-13 VITALS — BP 112/68 | HR 96 | Temp 98.3°F | Ht 61.0 in | Wt 158.0 lb

## 2012-03-13 DIAGNOSIS — G44209 Tension-type headache, unspecified, not intractable: Secondary | ICD-10-CM

## 2012-03-13 DIAGNOSIS — M79673 Pain in unspecified foot: Secondary | ICD-10-CM | POA: Insufficient documentation

## 2012-03-13 DIAGNOSIS — M79609 Pain in unspecified limb: Secondary | ICD-10-CM

## 2012-03-13 NOTE — Progress Notes (Signed)
  Subjective:    Patient ID: Kristi Evans, female    DOB: 12/12/1955, 56 y.o.   MRN: [id number]  HPI  Headaches About the same.  No change in frequency or severity.  No syncope or visual changes.  Has not be able to make many lifestyle changes to decrease stress - no time.  Is content with their current state  Heel pain Pain in R heel for last few weeks.  Comes and goes and does not seem to be related to walking.  No trauma.  Feels better with stretching or rolling can under foot.   No change in foottwear  Review of Symptoms - see HPI  PMH - Smoking status noted.      Review of Systems     Objective:   Physical Exam  R heel - mildly tender at insertion of plantar fascia.  No mass or redness.  No pain with stretching or walking.  Normal shoe wear pattern      Assessment & Plan:

## 2012-03-13 NOTE — Patient Instructions (Addendum)
Heel cup with arch support for the right heel pain  Great work with the weight  Take time for yourself

## 2012-03-13 NOTE — Assessment & Plan Note (Signed)
Perhaps intermittent cramps.  Not consistent with plantar fascitis.  No systemic symptoms.   Try inserts and monitor

## 2012-03-13 NOTE — Assessment & Plan Note (Signed)
Stable.  No signs of CNS disease.  Continue current regimen

## 2012-06-19 ENCOUNTER — Telehealth: Payer: Self-pay | Admitting: Gastroenterology

## 2012-06-19 NOTE — Telephone Encounter (Signed)
lmom for pt to call back. She should need a recall COLON 03/03/2013 unless she is having trouble.

## 2012-06-21 NOTE — Telephone Encounter (Signed)
Pt never called back. I mailed her a letter asking her to call if she was having problems, otherwise, she will receive a Recall letter in October or November to schedule a COLON.

## 2012-07-11 ENCOUNTER — Other Ambulatory Visit: Payer: Self-pay | Admitting: Family Medicine

## 2012-07-26 ENCOUNTER — Other Ambulatory Visit: Payer: Self-pay | Admitting: Family Medicine

## 2012-08-05 ENCOUNTER — Other Ambulatory Visit: Payer: Self-pay | Admitting: Family Medicine

## 2012-09-09 ENCOUNTER — Other Ambulatory Visit: Payer: Self-pay | Admitting: Family Medicine

## 2012-10-05 ENCOUNTER — Other Ambulatory Visit: Payer: Self-pay | Admitting: Family Medicine

## 2012-10-08 ENCOUNTER — Other Ambulatory Visit: Payer: Self-pay | Admitting: Family Medicine

## 2012-12-22 ENCOUNTER — Encounter: Payer: Self-pay | Admitting: Gastroenterology

## 2013-04-03 ENCOUNTER — Ambulatory Visit: Payer: 59 | Admitting: Family Medicine

## 2013-04-12 ENCOUNTER — Ambulatory Visit: Payer: 59 | Admitting: Family Medicine

## 2013-04-23 ENCOUNTER — Ambulatory Visit: Payer: 59 | Admitting: Family Medicine

## 2013-06-02 ENCOUNTER — Other Ambulatory Visit: Payer: Self-pay | Admitting: Family Medicine

## 2013-06-18 ENCOUNTER — Ambulatory Visit: Payer: 59 | Admitting: Family Medicine

## 2013-07-02 ENCOUNTER — Encounter: Payer: Self-pay | Admitting: Family Medicine

## 2013-07-02 ENCOUNTER — Ambulatory Visit (INDEPENDENT_AMBULATORY_CARE_PROVIDER_SITE_OTHER): Payer: 59 | Admitting: Family Medicine

## 2013-07-02 VITALS — BP 130/83 | HR 97 | Temp 98.5°F | Wt 158.0 lb

## 2013-07-02 DIAGNOSIS — K219 Gastro-esophageal reflux disease without esophagitis: Secondary | ICD-10-CM

## 2013-07-02 DIAGNOSIS — F329 Major depressive disorder, single episode, unspecified: Secondary | ICD-10-CM

## 2013-07-02 DIAGNOSIS — R42 Dizziness and giddiness: Secondary | ICD-10-CM

## 2013-07-02 DIAGNOSIS — G44209 Tension-type headache, unspecified, not intractable: Secondary | ICD-10-CM

## 2013-07-02 DIAGNOSIS — E7800 Pure hypercholesterolemia, unspecified: Secondary | ICD-10-CM

## 2013-07-02 DIAGNOSIS — F3289 Other specified depressive episodes: Secondary | ICD-10-CM

## 2013-07-02 LAB — LIPID PANEL
CHOL/HDL RATIO: 3.4 ratio
Cholesterol: 191 mg/dL (ref 0–200)
HDL: 57 mg/dL (ref >39)
LDL Cholesterol: 100 mg/dL — ABNORMAL HIGH (ref 0–99)
TRIGLYCERIDES: 170 mg/dL — AB (ref <150)
VLDL: 34 mg/dL (ref 0–40)

## 2013-07-02 MED ORDER — TRAMADOL HCL 50 MG PO TABS: 50.0000 mg | ORAL_TABLET | Freq: Four times a day (QID) | ORAL | Status: DC | PRN

## 2013-07-02 MED ORDER — OXYCODONE-ACETAMINOPHEN 5-325 MG PO TABS
1.0000 | ORAL_TABLET | Freq: Every day | ORAL | Status: DC
Start: 2013-07-02 — End: 2014-08-07

## 2013-07-02 NOTE — Progress Notes (Signed)
   Subjective:    Patient ID: Kristi Evans, female    DOB: 01/14/1956, 58 y.o.   MRN: [id number]  HPI  Depression Feels is stable.  Good spirits occasional trouble sleeping, good appetite.  Feels needs to continue her Elavil.  No side effects of constipation or  Dry mouth  GERD Stable when taking Prilosec.  No weight loss or trouble swallowing.  Does have symptoms if stops it  Headache  Only bothers her infrequently.  Tramadol helps when it does.  Rarely take oxycodone.  No focal visiual or strength changes.     HYPERLIPIDEMIA Symptoms Chest pain on exertion:  no   Leg claudication:   no Medications: Compliance- daily simvastatin Right upper quadrant pain- no  Muscle aches- no   Vertigo Has intermittent split second episodes of feeling like she is spinning. Has had on and off for long time.  Not increasing.  No hearing problems No tinnitus.  No falls or focal weakness   Review of Symptoms - see HPI  PMH - Smoking status noted.     Review of Systems     Objective:   Physical Exam  Alert no acute distress Heart - Regular rate and rhythm.  No murmurs, gallops or rubs.    Lungs:  Normal respiratory effort, chest expands symmetrically. Lungs are clear to auscultation, no crackles or wheezes. Extremities:  No cyanosis, edema, or deformity noted with good range of motion of all major joints.   Neurologic exam : Cn 2-7 intact Strength equal & normal in upper & lower extremities Able to walk on heels and toes.   Balance normal  Romberg normal, finger to nose  Ears:  External ear exam shows no significant lesions or deformities.  Otoscopic examination reveals clear canals, tympanic membranes are intact bilaterally without bulging, retraction, inflammation or discharge. Hearing is grossly normal bilaterall       Assessment & Plan:

## 2013-07-02 NOTE — Assessment & Plan Note (Signed)
Well controlled but needs regular PPI or gets symptoms

## 2013-07-02 NOTE — Assessment & Plan Note (Signed)
Check labs 

## 2013-07-02 NOTE — Assessment & Plan Note (Signed)
Well controlled on as needed analgesics that she takes responsibly

## 2013-07-02 NOTE — Patient Instructions (Addendum)
Good to see you today!  Thanks for coming in.  If the vertigo gets worse let me know  Try without the vitamins  You need a mammogram and follow up with your colonoscopy  I will call you if your tests are not good.  Otherwise I will send you a letter.  If you do not hear from me with in 2 weeks please call our office.

## 2013-07-02 NOTE — Assessment & Plan Note (Signed)
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  No signs of CNS disease or tumor.  Will observe since seems to be stable

## 2013-07-02 NOTE — Assessment & Plan Note (Signed)
Well controlled 

## 2013-07-03 ENCOUNTER — Encounter: Payer: Self-pay | Admitting: Family Medicine

## 2013-08-09 ENCOUNTER — Other Ambulatory Visit: Payer: Self-pay | Admitting: Family Medicine

## 2013-08-20 ENCOUNTER — Encounter: Payer: Self-pay | Admitting: Gastroenterology

## 2013-09-04 ENCOUNTER — Other Ambulatory Visit: Payer: Self-pay | Admitting: Family Medicine

## 2013-09-07 ENCOUNTER — Other Ambulatory Visit: Payer: Self-pay | Admitting: Family Medicine

## 2014-01-01 ENCOUNTER — Other Ambulatory Visit: Payer: Self-pay | Admitting: Family Medicine

## 2014-01-02 NOTE — Telephone Encounter (Signed)
Please call in tramadol-thanks 

## 2014-01-02 NOTE — Telephone Encounter (Signed)
Rx called in. Poet Hineman Dawn  

## 2014-02-18 ENCOUNTER — Ambulatory Visit: Payer: 59 | Admitting: Family Medicine

## 2014-05-15 ENCOUNTER — Encounter: Payer: Self-pay | Admitting: Family Medicine

## 2014-05-15 DIAGNOSIS — G8929 Other chronic pain: Secondary | ICD-10-CM | POA: Insufficient documentation

## 2014-06-03 ENCOUNTER — Encounter: Payer: Self-pay | Admitting: Internal Medicine

## 2014-06-07 ENCOUNTER — Other Ambulatory Visit: Payer: Self-pay | Admitting: Family Medicine

## 2014-07-05 ENCOUNTER — Ambulatory Visit (AMBULATORY_SURGERY_CENTER): Payer: Self-pay | Admitting: *Deleted

## 2014-07-05 VITALS — Ht 61.0 in | Wt 159.0 lb

## 2014-07-05 DIAGNOSIS — Z1211 Encounter for screening for malignant neoplasm of colon: Secondary | ICD-10-CM

## 2014-07-05 MED ORDER — MOVIPREP 100 G PO SOLR: 1.0000 | Freq: Once | ORAL | Status: DC

## 2014-07-05 NOTE — Progress Notes (Signed)
No egg or soy allergy. No anesthesia problems.  

## 2014-07-12 ENCOUNTER — Other Ambulatory Visit: Payer: Self-pay | Admitting: Family Medicine

## 2014-07-16 ENCOUNTER — Encounter: Payer: Self-pay | Admitting: Internal Medicine

## 2014-07-16 ENCOUNTER — Ambulatory Visit (AMBULATORY_SURGERY_CENTER): Payer: 59 | Admitting: Internal Medicine

## 2014-07-16 VITALS — BP 130/74 | HR 72 | Temp 96.9°F | Resp 20 | Ht 61.0 in | Wt 159.0 lb

## 2014-07-16 DIAGNOSIS — Z1211 Encounter for screening for malignant neoplasm of colon: Secondary | ICD-10-CM

## 2014-07-16 MED ORDER — SODIUM CHLORIDE 0.9 % IV SOLN: 500.0000 mL | INTRAVENOUS | Status: DC

## 2014-07-16 NOTE — Patient Instructions (Signed)
YOU HAD AN ENDOSCOPIC PROCEDURE TODAY AT THE [facility]:   Refer to the procedure report that was given to you for any specific questions about what was found during the examination.  If the procedure report does not answer your questions, please call your gastroenterologist to clarify.  If you requested that your care partner not be given the details of your procedure findings, then the procedure report has been included in a sealed envelope for you to review at your convenience later.  YOU SHOULD EXPECT: Some feelings of bloating in the abdomen. Passage of more gas than usual.  Walking can help get rid of the air that was put into your GI tract during the procedure and reduce the bloating. If you had a lower endoscopy (such as a colonoscopy or flexible sigmoidoscopy) you may notice spotting of blood in your stool or on the toilet paper. If you underwent a bowel prep for your procedure, you may not have a normal bowel movement for a few days.  Please Note:  You might notice some irritation and congestion in your nose or some drainage.  This is from the oxygen used during your procedure.  There is no need for concern and it should clear up in a day or so.  SYMPTOMS TO REPORT IMMEDIATELY:   Following lower endoscopy (colonoscopy or flexible sigmoidoscopy):  Excessive amounts of blood in the stool  Significant tenderness or worsening of abdominal pains  Swelling of the abdomen that is new, acute  Fever of 100F or higher   For urgent or emergent issues, a gastroenterologist can be reached at any hour by calling [phone].   DIET: Your first meal following the procedure should be a small meal and then it is ok to progress to your normal diet. Heavy or fried foods are harder to digest and may make you feel nauseous or bloated.  Likewise, meals heavy in dairy and vegetables can increase bloating.  Drink plenty of fluids but you should avoid alcoholic beverages for 24  hours.  ACTIVITY:  You should plan to take it easy for the rest of today and you should NOT DRIVE or use heavy machinery until tomorrow (because of the sedation medicines used during the test).    FOLLOW UP: Our staff will call the number listed on your records the next business day following your procedure to check on you and address any questions or concerns that you may have regarding the information given to you following your procedure. If we do not reach you, we will leave a message.  However, if you are feeling well and you are not experiencing any problems, there is no need to return our call.  We will assume that you have returned to your regular daily activities without incident.  If any biopsies were taken you will be contacted by phone or by letter within the next 1-3 weeks.  Please call us at [phone] if you have not heard about the biopsies in 3 weeks.    SIGNATURES/CONFIDENTIALITY: You and/or your care partner have signed paperwork which will be entered into your electronic medical record.  These signatures attest to the fact that that the information above on your After Visit Summary has been reviewed and is understood.  Full responsibility of the confidentiality of this discharge information lies with you and/or your care-partner.    Handout was given to your care partner on a high fiber diet with liberal fluid intake You might notice some irritation in   your nose or drainage.  This may cause feelings of congestion.  This is from the oxygen, which can be drying.  This is no cause for concern; this should clear up in a few days.  You may resume your current medications today. Please call if any questions or concerns.   

## 2014-07-16 NOTE — Op Note (Signed)
[facility] [address] [place], [id number]   COLONOSCOPY PROCEDURE REPORT  PATIENT: Kristi Evans, Kristi Evans  MR#: [id number] BIRTHDATE: 1956/02/02 , 58  yrs. old GENDER: female ENDOSCOPIST: Hart Carwinora M Isobel Eisenhuth, MD REFERRED AO:[id number]:Marshall Deirdre Priesthambliss, M.D. PROCEDURE DATE:  07/16/2014 PROCEDURE:   Colonoscopy, screening First Screening Colonoscopy - Avg.  risk and is 50 yrs.  old or older Yes.  Prior Negative Screening - Now for repeat screening. 10 or more years since last screening  History of Adenoma - Now for follow-up colonoscopy & has been > or = to 3 yrs.  N/A ASA CLASS:   Class I INDICATIONS:Screening for colonic neoplasia and Colorectal Neoplasm Risk Assessment for this procedure is average risk.,prior colonoscopy in November 2004 was normal MEDICATIONS: Monitored anesthesia care and Propofol 200 mg IV  DESCRIPTION OF PROCEDURE:   After the risks benefits and alternatives of the procedure were thoroughly explained, informed consent was obtained.  The digital rectal exam revealed no abnormalities of the rectum.   The LB [id number]  endoscope was introduced through the anus and advanced to the cecum, which was identified by both the appendix and ileocecal valve. No adverse events experienced.   The quality of the prep was good.  (MoviPrep was used)  The instrument was then slowly withdrawn as the colon was fully examined.      COLON FINDINGS: A normal appearing cecum, ileocecal valve, and appendiceal orifice were identified.  The ascending, transverse, descending, sigmoid colon, and rectum appeared unremarkable. Retroflexed views revealed no abnormalities. The time to cecum = 5.51 Withdrawal time = 7.11   The scope was withdrawn and the procedure completed. COMPLICATIONS: There were no immediate complications.  ENDOSCOPIC IMPRESSION: Normal colonoscopy  RECOMMENDATIONS: High fiber diet Recall colonoscopy in 10 years  eSigned:  Hart Carwinora M Declyn Delsol, MD  07/16/2014 11:46 AM   cc:

## 2014-07-16 NOTE — Progress Notes (Signed)
Awake and alert, vss,pleased with MAC

## 2014-07-17 ENCOUNTER — Telehealth: Payer: Self-pay | Admitting: *Deleted

## 2014-07-17 NOTE — Telephone Encounter (Signed)
  Follow up Call-no answer, left message to call if questions or concerns.     

## 2014-07-18 ENCOUNTER — Encounter: Payer: Self-pay | Admitting: Family Medicine

## 2014-08-07 ENCOUNTER — Ambulatory Visit (INDEPENDENT_AMBULATORY_CARE_PROVIDER_SITE_OTHER): Payer: 59 | Admitting: Family Medicine

## 2014-08-07 VITALS — BP 105/76 | HR 99 | Temp 98.1°F | Ht 61.0 in | Wt 159.0 lb

## 2014-08-07 DIAGNOSIS — G44209 Tension-type headache, unspecified, not intractable: Secondary | ICD-10-CM

## 2014-08-07 DIAGNOSIS — R42 Dizziness and giddiness: Secondary | ICD-10-CM | POA: Diagnosis not present

## 2014-08-07 DIAGNOSIS — E7800 Pure hypercholesterolemia, unspecified: Secondary | ICD-10-CM

## 2014-08-07 DIAGNOSIS — K219 Gastro-esophageal reflux disease without esophagitis: Secondary | ICD-10-CM | POA: Diagnosis not present

## 2014-08-07 MED ORDER — OXYCODONE-ACETAMINOPHEN 5-325 MG PO TABS: 1.0000 | ORAL_TABLET | Freq: Every day | ORAL | Status: DC

## 2014-08-07 MED ORDER — TRAMADOL HCL 50 MG PO TABS: 50.0000 mg | ORAL_TABLET | Freq: Four times a day (QID) | ORAL | Status: DC | PRN

## 2014-08-07 NOTE — Patient Instructions (Addendum)
Good to see you today!  Thanks for coming in.  Mammogram!  Come in fasting for blood tests - cal the day before.  I will call you if your tests are not good.  Otherwise I will send you a letter.  If you do not hear from me with in 2 weeks please call our office.

## 2014-08-07 NOTE — Assessment & Plan Note (Signed)
Stable.  Had recent colonoscopy.  Will check cbc.  Gave warning signs

## 2014-08-07 NOTE — Assessment & Plan Note (Signed)
Recurrent.  Sounds consistent with inner ear.  No red flags for CNS disease.  She prefers to observe for now without Physical Therapy or medications

## 2014-08-07 NOTE — Assessment & Plan Note (Signed)
Stable without red flags.  Continue current therapies

## 2014-08-07 NOTE — Progress Notes (Signed)
   Subjective:    Patient ID: Kristi Evans, female    DOB: 02/03/1956, 59 y.o.   MRN: [id number]  HPI  DIZZINESS  Feeling dizzy for on and off the last year.  Has been worse this spring. Dizziness is a momentary split second unstable feeling Feels like room spins: yes Lightheadedness when stands: no Palpitations or heart racing: no Medications tried: none does not last long enough Taking blood thinners: no  Symptoms Hearing Loss: no Ear Pain or fullness: no Nausea or vomiting: no Vision difficulty or double vision: no Falls: no Head trauma: no Weakness in arm or leg: no Speaking problems: no Headache: no changes has chronic migraines   Migraine Headache These are stable in type but a little more frequenlty in the spring she take elavil dialy and as needed tramadol.  Occasional oxycodone (less than 2-3 per month).  See symptoms above  Chief Complaint noted Review of Symptoms - see HPI PMH - Smoking status noted.   Vital Signs reviewed     Review of Systems     Objective:   Physical Exam Neurologic exam : Cn 2-7 intact Strength equal & normal in upper & lower extremities Able to walk on heels and toes.   Balance normal  Romberg normal, finger to nose  Ears:  External ear exam shows no significant lesions or deformities.  Otoscopic examination reveals clear canals, tympanic membranes are intact bilaterally without bulging, retraction, inflammation or discharge. Hearing is grossly normal bilaterall Eye - Pupils Equal Round Reactive to light, Extraocular movements intact, Fundi without hemorrhage or visible lesions, Conjunctiva without redness or discharge         Assessment & Plan:

## 2014-10-04 ENCOUNTER — Other Ambulatory Visit: Payer: Self-pay | Admitting: Family Medicine

## 2015-01-15 ENCOUNTER — Other Ambulatory Visit: Payer: Self-pay | Admitting: Family Medicine

## 2015-01-16 NOTE — Telephone Encounter (Signed)
Pls call in tramadol Rx Thanks LC  

## 2015-01-17 NOTE — Telephone Encounter (Signed)
LM on pharmacy VM with script information. Jazmin Hartsell,CMA

## 2015-02-17 ENCOUNTER — Telehealth: Payer: Self-pay | Admitting: *Deleted

## 2015-02-17 NOTE — Telephone Encounter (Signed)
Prior Authorization received from [facility] pharmacy for omeprazole dr 20 mg caps. PA completed online at [email or web address].  PA was denied via [employer].  Case ID number [id number].  Rx was changed to Omeprazole 20 mg #90: 1 PO daily PRN by Dr. Deirdre Priesthambliss.    Clovis PuMartin, Tamika L, RN

## 2015-02-26 ENCOUNTER — Encounter: Payer: Self-pay | Admitting: Family Medicine

## 2015-02-26 ENCOUNTER — Ambulatory Visit (INDEPENDENT_AMBULATORY_CARE_PROVIDER_SITE_OTHER): Payer: BLUE CROSS/BLUE SHIELD | Admitting: Family Medicine

## 2015-02-26 VITALS — BP 133/81 | HR 88 | Temp 98.1°F | Ht 61.0 in | Wt 156.0 lb

## 2015-02-26 DIAGNOSIS — Z23 Encounter for immunization: Secondary | ICD-10-CM | POA: Diagnosis not present

## 2015-02-26 DIAGNOSIS — Z1159 Encounter for screening for other viral diseases: Secondary | ICD-10-CM

## 2015-02-26 DIAGNOSIS — G44209 Tension-type headache, unspecified, not intractable: Secondary | ICD-10-CM

## 2015-02-26 DIAGNOSIS — F329 Major depressive disorder, single episode, unspecified: Secondary | ICD-10-CM

## 2015-02-26 DIAGNOSIS — Z114 Encounter for screening for human immunodeficiency virus [HIV]: Secondary | ICD-10-CM

## 2015-02-26 DIAGNOSIS — Z1239 Encounter for other screening for malignant neoplasm of breast: Secondary | ICD-10-CM | POA: Diagnosis not present

## 2015-02-26 DIAGNOSIS — E7800 Pure hypercholesterolemia, unspecified: Secondary | ICD-10-CM

## 2015-02-26 DIAGNOSIS — F32A Depression, unspecified: Secondary | ICD-10-CM

## 2015-02-26 MED ORDER — TRAMADOL HCL 50 MG PO TABS: 50.0000 mg | ORAL_TABLET | Freq: Four times a day (QID) | ORAL | Status: DC | PRN

## 2015-02-26 MED ORDER — OXYCODONE-ACETAMINOPHEN 5-325 MG PO TABS: 1.0000 | ORAL_TABLET | Freq: Every day | ORAL | Status: DC

## 2015-02-26 NOTE — Assessment & Plan Note (Signed)
Worsened strongly suggested a counselor - she will consider.  She did not want to change medications now

## 2015-02-26 NOTE — Patient Instructions (Signed)
Good to see you today!  Thanks for coming in.  I think seeing a counselor for a short time would be very helpful.  See who your insurance covers and consider making an appointment  Call if you are feeling worse  Come back in one month for follow up  I will call you if your lab tests are not normal.  Otherwise we will discuss them at your next visit.

## 2015-02-26 NOTE — Assessment & Plan Note (Signed)
Lab Results  Component Value Date   LDLCALC 100* 07/02/2013    Stable tolerating statin well

## 2015-02-26 NOTE — Assessment & Plan Note (Addendum)
Mild worsening due to stress.  No signs of intracranial disease.  Cautioned on using analgesics when is feeling more stress

## 2015-02-26 NOTE — Progress Notes (Signed)
   Subjective:    Patient ID: Kristi Evans, female    DOB: 01/29/1956, 59 y.o.   MRN: [id number]  HPI  DEPRESSION She feels is worsening due to lots of stress around her children and elderly mother.  Having trouble sleeping and concentrating and feels tired. Taking amitriptyline every day.  No suicidal ideation.   Has never seen a Veterinary surgeoncounselor.  Does use the serenity prayer  Headache Feels are happening more often due to above.  No focal weakness or visual changes.  Using tramadol and oxycodone infrequently.  HYPERLIPIDEMIA Symptoms Chest pain on exertion:  no   Leg claudication:   no Medications (modifying factor): Compliance- daily simvasatin  Right upper quadrant pain- no  Muscle aches- no Duration - years   Timing - continuous    Component Value Date/Time   CHOL 191 07/02/2013 1550   TRIG 170* 07/02/2013 1550   HDL 57 07/02/2013 1550   VLDL 34 07/02/2013 1550   CHOLHDL 3.4 07/02/2013 1550    GERD Has been having to buy omeprazole otc.  When misses feel lots of epigastric pain that raidates to back.  No vomiting or bleeding or weight loss.  Chief Complaint noted Review of Symptoms - see HPI PMH - Smoking status noted.   Vital Signs reviewed     Review of Systems     Objective:   Physical Exam  Psych:  Cognition and judgment appear intact. Alert, communicative  and cooperative with normal attention span and concentration. No apparent delusions, illusions, hallucinations.  Does appear intermittently sad        Assessment & Plan:

## 2015-02-27 LAB — CBC
HCT: 40.9 % (ref 36.0–46.0)
Hemoglobin: 14.1 g/dL (ref 12.0–15.0)
MCH: 31.1 pg (ref 26.0–34.0)
MCHC: 34.5 g/dL (ref 30.0–36.0)
MCV: 90.3 fL (ref 78.0–100.0)
MPV: 10.2 fL (ref 8.6–12.4)
PLATELETS: 274 10*3/uL (ref 150–400)
RBC: 4.53 MIL/uL (ref 3.87–5.11)
RDW: 13 % (ref 11.5–15.5)
WBC: 6.8 10*3/uL (ref 4.0–10.5)

## 2015-02-27 LAB — HIV ANTIBODY (ROUTINE TESTING W REFLEX): HIV: NONREACTIVE

## 2015-02-27 LAB — TSH: TSH: 1.465 u[IU]/mL (ref 0.350–4.500)

## 2015-02-27 LAB — HEPATITIS C ANTIBODY: HCV AB: NEGATIVE

## 2015-07-06 ENCOUNTER — Other Ambulatory Visit: Payer: Self-pay | Admitting: Family Medicine

## 2015-08-03 ENCOUNTER — Other Ambulatory Visit: Payer: Self-pay | Admitting: Family Medicine

## 2015-08-04 ENCOUNTER — Other Ambulatory Visit: Payer: Self-pay | Admitting: Family Medicine

## 2015-08-04 DIAGNOSIS — Z1231 Encounter for screening mammogram for malignant neoplasm of breast: Secondary | ICD-10-CM

## 2015-08-19 ENCOUNTER — Ambulatory Visit: Payer: BLUE CROSS/BLUE SHIELD

## 2015-08-27 ENCOUNTER — Other Ambulatory Visit: Payer: Self-pay | Admitting: Family Medicine

## 2015-08-28 NOTE — Telephone Encounter (Signed)
Pls call in Rx for tramadol Thanks  LC

## 2015-08-29 NOTE — Telephone Encounter (Signed)
Rx called into pharmacy, pt contacted.

## 2015-09-10 ENCOUNTER — Telehealth: Payer: Self-pay | Admitting: Family Medicine

## 2015-09-10 ENCOUNTER — Ambulatory Visit (INDEPENDENT_AMBULATORY_CARE_PROVIDER_SITE_OTHER): Payer: BLUE CROSS/BLUE SHIELD | Admitting: Family Medicine

## 2015-09-10 ENCOUNTER — Encounter: Payer: Self-pay | Admitting: Family Medicine

## 2015-09-10 VITALS — BP 110/60 | HR 78 | Temp 98.3°F | Ht 61.0 in | Wt 156.0 lb

## 2015-09-10 DIAGNOSIS — G43009 Migraine without aura, not intractable, without status migrainosus: Secondary | ICD-10-CM | POA: Diagnosis not present

## 2015-09-10 DIAGNOSIS — G43909 Migraine, unspecified, not intractable, without status migrainosus: Secondary | ICD-10-CM | POA: Insufficient documentation

## 2015-09-10 MED ORDER — SUMATRIPTAN SUCCINATE 6 MG/0.5ML ~~LOC~~ SOLN
6.0000 mg | Freq: Once | SUBCUTANEOUS | Status: AC
  Administered 2015-09-10: 6 mg via SUBCUTANEOUS

## 2015-09-10 NOTE — Assessment & Plan Note (Signed)
Persistent Migraine that responded to imitrex.  No signs of focal CNS disease  May recur. Will have her back to discuss using oral imitrex

## 2015-09-10 NOTE — Telephone Encounter (Signed)
Left VM that I had called

## 2015-09-10 NOTE — Progress Notes (Signed)
   Subjective:    Patient ID: Kristi Evans, female    DOB: 02/19/1956, 60 y.o.   MRN: [id number]  HPI HEADACHE  Headache started 3 days ago Pain is posterior and pounding - similar to prior migraines Severity:  Has been in bed almost whole time Location: all over and in back  Of head Medications tried: oxycodone and tramadol Head trauma: no Sudden onset: as usual  Previous similar headaches: yes Taking blood thinners: no History of cancer: no  Symptoms Nose congestion stuffiness:  no Nausea vomiting: occsl  Photophobia: yes Noise sensitivity: yes Double vision or loss of vision: no Fever: no Neck Stiffness: no Trouble walking or speaking: no  Patient thinks cause of headache might be: regular migraine - not sure what triggered   Chief Complaint noted Review of Symptoms - see HPI PMH - Smoking status noted.   Vital Signs reviewed     Review of Systems     Objective:   Physical Exam  Alert interactive but in pain Neurologic exam : Cn 2-7 intact Strength equal & normal in upper & lower extremities Eye - Pupils Equal Round Reactive to light, Extraocular movements intact, Fundi without hemorrhage or visible lesions, Conjunctiva without redness or discharge  Patient given imitrex injection.  After about 30 minutes her headache was much improved.  Felt tired but able to drive Alert oriented.  Romberg normal.  Normal gait             Assessment & Plan:

## 2015-09-10 NOTE — Patient Instructions (Signed)
Go home and rest  If the headache comes back call me  If you have any weakness or visual changes call immediately  Come back in one month for a recheck of blood pressure and headache s

## 2016-01-27 ENCOUNTER — Ambulatory Visit
Admission: RE | Admit: 2016-01-27 | Discharge: 2016-01-27 | Disposition: A | Discharge status: Home / Self Care | Payer: BLUE CROSS/BLUE SHIELD | Source: Ambulatory Visit | Attending: Family Medicine | Admitting: Family Medicine

## 2016-01-27 DIAGNOSIS — Z1231 Encounter for screening mammogram for malignant neoplasm of breast: Secondary | ICD-10-CM

## 2016-02-03 ENCOUNTER — Encounter: Payer: Self-pay | Admitting: Student

## 2016-02-03 ENCOUNTER — Ambulatory Visit (INDEPENDENT_AMBULATORY_CARE_PROVIDER_SITE_OTHER): Payer: BLUE CROSS/BLUE SHIELD | Admitting: Student

## 2016-02-03 VITALS — BP 127/79 | HR 90 | Temp 98.1°F | Ht 61.0 in | Wt 161.4 lb

## 2016-02-03 DIAGNOSIS — M545 Low back pain, unspecified: Secondary | ICD-10-CM | POA: Insufficient documentation

## 2016-02-03 DIAGNOSIS — Z23 Encounter for immunization: Secondary | ICD-10-CM
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
## 2016-02-10 ENCOUNTER — Encounter: Payer: Self-pay | Admitting: Family Medicine

## 2016-02-10 ENCOUNTER — Ambulatory Visit (INDEPENDENT_AMBULATORY_CARE_PROVIDER_SITE_OTHER): Payer: BLUE CROSS/BLUE SHIELD | Admitting: Family Medicine

## 2016-02-10 VITALS — BP 124/79 | HR 85 | Temp 98.1°F | Wt 161.0 lb

## 2016-02-10 DIAGNOSIS — M549 Dorsalgia, unspecified: Secondary | ICD-10-CM | POA: Diagnosis not present

## 2016-02-10 MED ORDER — CYCLOBENZAPRINE HCL 10 MG PO TABS: 10.0000 mg | ORAL_TABLET | Freq: Three times a day (TID) | ORAL | 0 refills | Status: DC | PRN

## 2016-02-10 MED ORDER — MELOXICAM 15 MG PO TABS: 15.0000 mg | ORAL_TABLET | Freq: Every day | ORAL | 0 refills | Status: DC

## 2016-02-10 NOTE — Patient Instructions (Signed)
Take the mobic once daily for 2 weeks. Continue the muscle relaxers.  If you are not getting better in 2 weeks, please let us know.  Take care,  Dr Jimmey RalphParker
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
## 2016-02-18 ENCOUNTER — Ambulatory Visit: Payer: BLUE CROSS/BLUE SHIELD | Admitting: Family Medicine

## 2016-03-03 ENCOUNTER — Encounter: Payer: Self-pay | Admitting: Family Medicine

## 2016-03-03 ENCOUNTER — Ambulatory Visit (INDEPENDENT_AMBULATORY_CARE_PROVIDER_SITE_OTHER): Payer: BLUE CROSS/BLUE SHIELD | Admitting: Family Medicine

## 2016-03-03 DIAGNOSIS — M545 Low back pain, unspecified: Secondary | ICD-10-CM

## 2016-03-03 MED ORDER — OXYCODONE-ACETAMINOPHEN 5-325 MG PO TABS: 1.0000 | ORAL_TABLET | Freq: Every day | ORAL | 0 refills | Status: DC

## 2016-03-03 MED ORDER — TRAMADOL HCL 50 MG PO TABS: 50.0000 mg | ORAL_TABLET | Freq: Four times a day (QID) | ORAL | 5 refills | Status: DC | PRN
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
## 2016-03-03 NOTE — Patient Instructions (Addendum)
Good to see you today!  Thanks for coming in.  Exercises  Stretching  - 3-5 sets three times a day    Walking on tip toes and heels  Deep knee bends  Toe Touches  Shoulder - 3 movements   Neck - 6 movements  If you are not better meaning - back to your normal medicines in 2 weeks then call me and we will do Physical Therapy   For the flexaril taper to not using over the next two weeks  Use the Mobic as needed but use Tylenol arthritis   Get the tramadol down to 1 per day  If the abdomen pains are not getting better come back to see me or your GI doctor  Come back for a check up in 3-4 months
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
## 2016-04-08 ENCOUNTER — Other Ambulatory Visit: Payer: Self-pay | Admitting: Family Medicine

## 2016-08-08 ENCOUNTER — Other Ambulatory Visit: Payer: Self-pay | Admitting: Family Medicine

## 2016-08-11 ENCOUNTER — Other Ambulatory Visit: Payer: Self-pay | Admitting: Family Medicine

## 2016-08-12 NOTE — Telephone Encounter (Signed)
Called Tramadol  #30 1 po q 6 hours prn with 5 refills to [facility] [address] per Dr. Pearlean Brownie MD

## 2016-08-12 NOTE — Telephone Encounter (Signed)
Please call in Rx for tramadol.  Thanks.

## 2016-08-12 NOTE — Telephone Encounter (Signed)
Called Rx to [facility] [address]

## 2016-10-06 ENCOUNTER — Ambulatory Visit: Payer: BLUE CROSS/BLUE SHIELD | Admitting: Family Medicine

## 2016-10-22 ENCOUNTER — Other Ambulatory Visit: Payer: Self-pay | Admitting: Family Medicine

## 2016-12-01 ENCOUNTER — Ambulatory Visit (HOSPITAL_COMMUNITY)
Admission: RE | Admit: 2016-12-01 | Discharge: 2016-12-01 | Disposition: A | Discharge status: Home / Self Care | Payer: BLUE CROSS/BLUE SHIELD | Source: Ambulatory Visit | Attending: Family Medicine | Admitting: Family Medicine

## 2016-12-01 ENCOUNTER — Encounter: Payer: Self-pay | Admitting: Family Medicine

## 2016-12-01 ENCOUNTER — Ambulatory Visit (INDEPENDENT_AMBULATORY_CARE_PROVIDER_SITE_OTHER): Payer: BLUE CROSS/BLUE SHIELD | Admitting: Family Medicine

## 2016-12-01 VITALS — BP 120/72 | HR 90 | Temp 98.5°F | Ht 61.0 in | Wt 154.4 lb

## 2016-12-01 DIAGNOSIS — R42 Dizziness and giddiness: Secondary | ICD-10-CM

## 2016-12-01 DIAGNOSIS — R0602 Shortness of breath: Secondary | ICD-10-CM

## 2016-12-01 DIAGNOSIS — R079 Chest pain, unspecified: Secondary | ICD-10-CM

## 2016-12-01 DIAGNOSIS — F329 Major depressive disorder, single episode, unspecified: Secondary | ICD-10-CM

## 2016-12-01 DIAGNOSIS — G8929 Other chronic pain: Secondary | ICD-10-CM | POA: Diagnosis not present

## 2016-12-01 DIAGNOSIS — F32A Depression, unspecified: Secondary | ICD-10-CM

## 2016-12-01 MED ORDER — OXYCODONE-ACETAMINOPHEN 5-325 MG PO TABS: 1.0000 | ORAL_TABLET | Freq: Every day | ORAL | 0 refills | Status: DC

## 2016-12-01 NOTE — Patient Instructions (Addendum)
Good to see you today!  Thanks for coming in.  I will call you if your lab tests are not normal.  Otherwise we will discuss them at your next visit.  We will refer your to cardiology  If you have chest pain or shortness of breath that stays and does not go away with rest go to the ER  Come back in 2-3 weeks

## 2016-12-01 NOTE — Progress Notes (Signed)
Subjective  Patient is presenting with the following illnesses  DIZZINESS Feeling dizzy for months but has been worsening. Dizziness is very hard to describe.  Feels sort of wavey but no definite spinning or lightheadness.  Not related to position or exertion.  Can occur anytime.  Has taken to holding on to thing when walks but no falls or syncope Feels like room spins: not really Lightheadedness when stands: sometimes Palpitations or heart racing: no Prior dizziness: has had on and off but never this bad Medications tried: none Taking blood thinners: no Symptoms Hearing Loss: no Ear Pain or fullness: no Nausea or vomiting: occaisionally Vision difficulty or double vision: no Falls: no Head trauma: no Weakness in arm or leg: no Speaking problems: no Headache: has her chronic headache that has had for years  SHORTNESS OF BREATH  Has been short of breath for several weeks. Severity: has to stop and catch breath when walking Short of breath with: most exertion seems to be increasing New Medications Kidney problems: no Heart problems: no History of cancer: no  Symptoms Fever: no Sputum: no Wheezing or asthma: no Leg swelling: no Chest Pain: no Immobility: no Stomach pain or black bowel movements: no Severe snoring or daytime sleepiness: no Weight loss: no  ROS see HPI Smoking Status noted  DEPRESSION Lots of family stressors.  PHQ9 = 11  Does not want to talk to a counselor      Chief Complaint noted Review of Symptoms - see HPI PMH - Smoking status noted.     Objective Vital Signs reviewed Alert nad Psych:  Cognition and judgment appear intact. Alert, communicative  and cooperative with normal attention span and concentration. No apparent delusions, illusions, hallucinations Heart - Regular rate and rhythm.  No murmurs, gallops or rubs.    Lungs:  Normal respiratory effort, chest expands symmetrically. Lungs are clear to auscultation, no crackles or  wheezes. Abdomen: soft and non-tender without masses, organomegaly or hernias noted.  No guarding or rebound Extremities:  No cyanosis, edema, or deformity noted with good range of motion of all major joints.   Neurologic exam : Cn 2-7 intact Strength equal & normal in upper & lower extremities Able to walk on heels and toes but with holding on  Balance normal  Romberg normal, finger to nose normal   ECG noted - no st changes, poor R wave progression SR  Ambulatory pox - normal    Assessments/Plans  No problem-specific Assessment & Plan notes found for this encounter.   See Encounter view if individual problem A/Ps not visible See after visit summary for details of patient instuctions

## 2016-12-02 LAB — CMP14+EGFR
A/G RATIO: 1.9 (ref 1.2–2.2)
ALT: 24 IU/L (ref 0–32)
AST: 25 IU/L (ref 0–40)
Albumin: 4.3 g/dL (ref 3.6–4.8)
Alkaline Phosphatase: 94 IU/L (ref 39–117)
BUN/Creatinine Ratio: 10 — ABNORMAL LOW (ref 12–28)
BUN: 8 mg/dL (ref 8–27)
Bilirubin Total: 0.3 mg/dL (ref 0.0–1.2)
CALCIUM: 9.7 mg/dL (ref 8.7–10.3)
CO2: 23 mmol/L (ref 20–29)
CREATININE: 0.83 mg/dL (ref 0.57–1.00)
Chloride: 103 mmol/L (ref 96–106)
GFR, EST AFRICAN AMERICAN: 89 mL/min/{1.73_m2} (ref >59)
GFR, EST NON AFRICAN AMERICAN: 77 mL/min/{1.73_m2} (ref >59)
Globulin, Total: 2.3 g/dL (ref 1.5–4.5)
Glucose: 105 mg/dL — ABNORMAL HIGH (ref 65–99)
POTASSIUM: 4.4 mmol/L (ref 3.5–5.2)
Sodium: 140 mmol/L (ref 134–144)
TOTAL PROTEIN: 6.6 g/dL (ref 6.0–8.5)

## 2016-12-02 LAB — CBC
HEMOGLOBIN: 13 g/dL (ref 11.1–15.9)
Hematocrit: 39.4 % (ref 34.0–46.6)
MCH: 30.1 pg (ref 26.6–33.0)
MCHC: 33 g/dL (ref 31.5–35.7)
MCV: 91 fL (ref 79–97)
Platelets: 265 10*3/uL (ref 150–379)
RBC: 4.32 x10E6/uL (ref 3.77–5.28)
RDW: 13.4 % (ref 12.3–15.4)
WBC: 6.4 10*3/uL (ref 3.4–10.8)

## 2016-12-02 LAB — TSH: TSH: 2.24 u[IU]/mL (ref 0.450–4.500)

## 2016-12-02 NOTE — Assessment & Plan Note (Signed)
Using narcotic very sparingly and appropriately

## 2016-12-02 NOTE — Assessment & Plan Note (Signed)
Stable

## 2016-12-02 NOTE — Assessment & Plan Note (Signed)
Atypical not firmly consistent with vertigo or orthostasis.  No signs of focal cns lesion.  Will check for anemia and work up her shortness of breath

## 2016-12-02 NOTE — Assessment & Plan Note (Signed)
Subacute onset.  Unlikely but most concerning is CAD equivalent.  Will refer to cardilogy.  Check labs for anemia and renal failure.  May need PFTs if persists and cardiac work up is normal.  Has a large amount of family stress in her life

## 2016-12-15 ENCOUNTER — Ambulatory Visit: Payer: BLUE CROSS/BLUE SHIELD | Admitting: Family Medicine

## 2016-12-16 ENCOUNTER — Telehealth: Payer: Self-pay | Admitting: Family Medicine

## 2016-12-16 NOTE — Telephone Encounter (Signed)
Called to check about missed appointment  Left VM  She could call us

## 2016-12-17 NOTE — Telephone Encounter (Signed)
Left another VM 

## 2016-12-20 ENCOUNTER — Ambulatory Visit (HOSPITAL_COMMUNITY)
Admission: RE | Admit: 2016-12-20 | Discharge: 2016-12-20 | Disposition: A | Discharge status: Home / Self Care | Payer: BLUE CROSS/BLUE SHIELD | Source: Ambulatory Visit | Attending: Family Medicine | Admitting: Family Medicine

## 2016-12-20 ENCOUNTER — Telehealth: Payer: Self-pay | Admitting: *Deleted

## 2016-12-20 ENCOUNTER — Ambulatory Visit (INDEPENDENT_AMBULATORY_CARE_PROVIDER_SITE_OTHER): Payer: BLUE CROSS/BLUE SHIELD | Admitting: Family Medicine

## 2016-12-20 VITALS — BP 130/80 | HR 101 | Temp 98.7°F | Ht 61.0 in | Wt 154.6 lb

## 2016-12-20 DIAGNOSIS — M25562 Pain in left knee: Secondary | ICD-10-CM | POA: Insufficient documentation

## 2016-12-20 DIAGNOSIS — R609 Edema, unspecified: Secondary | ICD-10-CM | POA: Insufficient documentation

## 2016-12-20 DIAGNOSIS — M25762 Osteophyte, left knee: Secondary | ICD-10-CM | POA: Insufficient documentation

## 2016-12-20 NOTE — Patient Instructions (Signed)
Analiza, you were seen today for knee pain.  At this point, it is unclear if you have a broken bone.  I want to check some imaging to make sure.   In the meantime continue with walking and range of motion exercises.   I will call you if there are any issues.   Please follow up in 4 weeks otherwise.  You can continue to ice the areas and take ibuprofen/tylenol and tramadol as needed.   Take care, Kristi Evans L. Myrtie Soman, MD [facility] Resident PGY-2 12/20/2016 11:01 AM

## 2016-12-20 NOTE — Assessment & Plan Note (Signed)
Right knee pain 2/2 fall with unknown mechanism of injury. Point tenderness at medial and lateral joint lines with R>L and point tenderness at medial tibial plateau and medial femoral condyle. Reports instability and swelling in AM after injury. Exam limited d/t pain and therefore injury is unclear at this time. Will get imaging to further correlate.  - DG left knee and tib-fib studies - recommend continued ambulation/ROM exercises unless images show fracture - ibuprofen/tylenol/ICE PRN - return precautions discussed

## 2016-12-20 NOTE — Addendum Note (Signed)
Addended by: Lamonte Sakai, APRIL D on: 12/20/2016 04:01 PM   Modules accepted: Orders

## 2016-12-20 NOTE — Telephone Encounter (Signed)
Tibia/Fibula order incorrectly.  Procedure re-ordered. Clovis Pu, RN

## 2016-12-20 NOTE — Progress Notes (Signed)
    Subjective:  Kristi Evans is a 61 y.o. female who presents to the [facility] today with a chief complaint of left knee pain after fall.   HPI:  EXTREMITY PAIN  Location: left knee Pain started: one week ago after a fall down a set of stairs in the middle of the night. Unable to remember the mechanism of injury and if there was an immediate swelling, but there was immediate pain. She went back to bed after injury, but woke up with swelling and pain.  Pain is: continuous and worse with walking, bending and palpation.  Severity: 6/10 with walking, bending, 3/10 while sitting Medications tried: tramadol (uses for HA), ICE Recent trauma: fell on right knee Similar pain previously: no  Symptoms Redness: no Swelling: yes Fever: no Weakness: 2/2 pain Weight loss: none Rash: no  ROS: denies any numbness, tingling, locking or popping.  Does report feelings of instability and possible loose body at medial aspect of left knee.   Review of Symptoms - see HPI PMH - Smoking status noted.     Objective:  Physical Exam: BP 130/80 (BP Location: Left Arm, Patient Position: Sitting, Cuff Size: Normal)   Pulse (!) 101   Temp 98.7 F (37.1 C) (Oral)   Ht 5\' 1"  (1.549 m)   Wt 154 lb 9.6 oz (70.1 kg)   BMI 29.21 kg/m   Gen: 61yo F in NAD, but uncomfortable MSK: R knee grossly swollen and bruised particularly on the medial and inferior knee with ROM limited to 45 degrees 2/2 pain and externally rotated. Also with medial and lateral joint-line tenderness worse medially. Special tests were not able to be performed 2/2 pain. Also TTP along tibia with fading ecchymosis now yellow/purple. No obvious effusion.  Skin: warm, dry, bruising on left shin and knee, no warmth   No results found for this or any previous visit (from the past 72 hour(s)).   Assessment/Plan:  Acute pain of left knee Right knee pain 2/2 fall with unknown mechanism of injury. Point tenderness at medial and lateral joint lines  with R>L and point tenderness at medial tibial plateau and medial femoral condyle. Reports instability and swelling in AM after injury. Exam limited d/t pain and therefore injury is unclear at this time. Will get imaging to further correlate.  - DG left knee and tib-fib studies - recommend continued ambulation/ROM exercises unless images show fracture - ibuprofen/tylenol/ICE PRN - return precautions discussed

## 2016-12-21 ENCOUNTER — Telehealth: Payer: Self-pay | Admitting: *Deleted

## 2016-12-21 NOTE — Telephone Encounter (Signed)
Patient called requesting X-ray results from 12/20/16.  Please give her a call at [phone]. Clovis Pu, RN

## 2016-12-21 NOTE — Telephone Encounter (Signed)
Left message all was ok Can call if questions

## 2016-12-22 ENCOUNTER — Encounter: Payer: Self-pay | Admitting: Family Medicine

## 2016-12-22 ENCOUNTER — Ambulatory Visit (INDEPENDENT_AMBULATORY_CARE_PROVIDER_SITE_OTHER): Payer: BLUE CROSS/BLUE SHIELD | Admitting: Family Medicine

## 2016-12-22 DIAGNOSIS — R0602 Shortness of breath: Secondary | ICD-10-CM | POA: Diagnosis not present

## 2016-12-22 DIAGNOSIS — R42 Dizziness and giddiness: Secondary | ICD-10-CM | POA: Diagnosis not present

## 2016-12-22 DIAGNOSIS — Z8669 Personal history of other diseases of the nervous system and sense organs: Secondary | ICD-10-CM

## 2016-12-22 DIAGNOSIS — M25562 Pain in left knee: Secondary | ICD-10-CM | POA: Diagnosis not present

## 2016-12-22 NOTE — Assessment & Plan Note (Signed)
Slight improvement.  Continue conservative therapy.  Will check for stability and possible internal derangement once soft tissue swelling and pain subsides

## 2016-12-22 NOTE — Progress Notes (Addendum)
Subjective  Patient is presenting with the following illnesses  LEFT LEG TRAUMA Feeling a little better with improved range of motion and walking and slight decrease in pain. Feels something is "sticking" when she tries to walk.  No shortness of breath no new swelling no worsening erythema.  Recent xray - no fracture   Dyspnea - Chest Pain No recent episodes.  Has appointment with cardiology in 2 weeks.   Does have a remote history of thoracic outlet syndrome perhaps?  Dizziness Feels unsteady sometimes but hard to tell with her leg injury.  No focal weakness or visual changes     Chief Complaint noted Review of Symptoms - see HPI PMH - Smoking status noted.     Objective Vital Signs reviewed Left Leg - can achieve almost full extension Diffusely tender - cant check stability tests due to pain Able to bear weight with limp improved from last visit No calf soft tissue swelling  Heart - Regular rate and rhythm.  No murmurs, gallops or rubs.    Lungs:  Normal respiratory effort, chest expands symmetrically. Lungs are clear to auscultation, no crackles or wheezes. Shoulder - FROM bilaterally.  Good radial pulse does not change with had elevation      Assessments/Plans  No problem-specific Assessment & Plan notes found for this encounter.   See Encounter view if individual problem A/Ps not visible See after visit summary for details of patient instuctions
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
## 2016-12-22 NOTE — Assessment & Plan Note (Signed)
Stable awaiting cardiac evaluation.  Unfortunately will likely not be able to do EST with leg injury

## 2016-12-22 NOTE — Patient Instructions (Signed)
Good to see you today!  Thanks for coming in.  Stay healthy  Keep active, use ice the knee should slowly get better. No heavy lifting or long walking   See the cardiologist for the chest pain  Come back and see me in 4-6 weeks we will look at the knee and see what the cardiologist said

## 2017-01-04 ENCOUNTER — Encounter: Payer: Self-pay | Admitting: Cardiology

## 2017-01-04 ENCOUNTER — Ambulatory Visit (INDEPENDENT_AMBULATORY_CARE_PROVIDER_SITE_OTHER): Payer: BLUE CROSS/BLUE SHIELD | Admitting: Cardiology

## 2017-01-04 VITALS — BP 128/90 | HR 77 | Ht 61.0 in | Wt 157.4 lb

## 2017-01-04 DIAGNOSIS — R42 Dizziness and giddiness: Secondary | ICD-10-CM | POA: Diagnosis not present

## 2017-01-04 DIAGNOSIS — R079 Chest pain, unspecified: Secondary | ICD-10-CM | POA: Diagnosis not present

## 2017-01-04 DIAGNOSIS — R0602 Shortness of breath: Secondary | ICD-10-CM

## 2017-01-04 NOTE — Patient Instructions (Signed)
SCHEDULE AT [address] Your physician has recommended that you wear a holter monitor. Holter monitors are medical devices that record the heart's electrical activity. Doctors most often use these monitors to diagnose arrhythmias. Arrhythmias are problems with the speed or rhythm of the heartbeat. The monitor is a small, portable device. You can wear one while you do your normal daily activities. This is usually used to diagnose what is causing palpitations/syncope (passing out).   SCHEDULE AT  [address] Your physician has requested that you have a lexiscan myoview. For further information please visit [email or web address]. Please follow instruction sheet, as given.    Your physician recommends that you schedule a follow-up appointment in 1- 2 MONTHS WITH DR HARDING Follow -up TESTS

## 2017-01-04 NOTE — Progress Notes (Signed)
PCP: Carney Livinghambliss, Marshall L, MD  Clinic Note: Chief Complaint  Patient presents with  . Dizziness    pt c/o dizzines  . Shortness of Breath    pt c/o shortness of breath    HPI: Kristi Evans is a 61 y.o. female who is being seen today for the evaluation of chest pain/SOB at the request of Carney LivingChambliss, Marshall L, *. She has history of hyperlipidemia and GERD. She quit smoking back in 2009. Her father and both sets of grandparents had cardiac disease in her mother had a stroke.  Kristi Evans was last seen on August 1 by her PCP for chest pain & dizziness (and subsequently has had left knee pain visit.  Recent Hospitalizations: none  Studies Personally Reviewed - (if available, images/films reviewed: From Epic Chart or Care Everywhere)  none  Interval History: Kristi Evans presents today noting that she has been having episodes of dizziness off & on for several months now.  No spinning sensation or real loss of balance / falls.  Episodes can occur at anytime - while at rest sitting, lying or standing & can also occur with routine activity.  She does not really indicate that exertion makes it worse.  She does have dizziness / lightheadedness that may make her feel as though she could pass out (near/pre syncope). She feels her HR increase, but besides a feeling of increased HR, she denies any irregular palpitations, or syncope. She does note that when the HR goes up is when she feels chest discomfort.  +/- associated with her dizzy spells, she also has noted dyspnea spells that may also occur at rest or with activity, but if exerting herself, she has to stop in order to recover.   She states that when the dizziness & dyspnea occur together, she will often feel some tightness across the chest making it difficult to catch her breat.  She has note dL arm& hand tingling & numbness that is also intermittent, but occurring more frequently.  No PND or orthopnea & rare pedal edema.   No  TIA/amaurosis fugax symptoms. No melena, hematochezia, hematuria, or epstaxis. No claudication.  ROS: A comprehensive was performed. Pertinent Sx noted above. Review of Systems  Constitutional: Negative for chills, fever and malaise/fatigue.  HENT: Negative for congestion, hearing loss and sore throat.   Respiratory: Negative for cough and wheezing.   Cardiovascular: Negative for claudication.  Gastrointestinal: Negative for blood in stool, constipation and melena.  Genitourinary: Negative for flank pain, frequency and hematuria.  Musculoskeletal: Negative for back pain, falls and joint pain.  Neurological: Positive for dizziness and headaches. Negative for focal weakness and loss of consciousness.  Psychiatric/Behavioral: Negative for depression and memory loss. The patient is nervous/anxious. The patient does not have insomnia.   All other systems reviewed and are negative.  I have reviewed and (if needed) personally updated the patient's problem list, medications, allergies, past medical and surgical history, social and family history.   Past Medical History:  Diagnosis Date  . GERD (gastroesophageal reflux disease)   . Hyperlipidemia     Past Surgical History:  Procedure Laterality Date  . ABDOMINAL HYSTERECTOMY  2007  . ANKLE SURGERY    . COLONOSCOPY  2004  . UPPER GASTROINTESTINAL ENDOSCOPY  2004    Current Meds  Medication Sig  . amitriptyline (ELAVIL) 150 MG tablet TAKE 1 TABLET BY MOUTH AT BEDTIME.  Marland Kitchen. omeprazole (PRILOSEC) 20 MG capsule TAKE 1 CAPSULE TWICE DAILY  . oxyCODONE-acetaminophen (PERCOCET/ROXICET) 5-325 MG  tablet Take 1 tablet by mouth daily. For severe headaches.  No more than 1 a day  . simvastatin (ZOCOR) 40 MG tablet TAKE 1 TABLET (40 MG TOTAL) BY MOUTH AT BEDTIME.  . traMADol (ULTRAM) 50 MG tablet TAKE 1 TABLET BY MOUTH EVERY 6 HOURS AS NEEDED    Allergies  Allergen Reactions  . Aspirin     REACTION: unspecified  . Codeine Phosphate      REACTION: unspecified    Social History   Social History  . Marital status: Married    Spouse name: N/A  . Number of children: N/A  . Years of education: N/A   Social History Main Topics  . Smoking status: Former Smoker    Quit date: 10/21/2007  . Smokeless tobacco: Never Used  . Alcohol use No  . Drug use: No  . Sexual activity: Yes    Partners: Male   Other Topics Concern  . 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 She is not really sure of the details of the heart disease.  Wt Readings from Last 3 Encounters:  01/04/17 157 lb 6.4 oz (71.4 kg)  12/22/16 156 lb 12.8 oz (71.1 kg)  12/20/16 154 lb 9.6 oz (70.1 kg)    PHYSICAL EXAM BP 128/90   Pulse 77   Ht 5\' 1"  (1.549 m)   Wt 157 lb 6.4 oz (71.4 kg)   BMI 29.74 kg/m    Orthostatic VS for the past 24 hrs:  BP- Lying Pulse- Lying BP- Sitting Pulse- Sitting BP- Standing at 0 minutes Pulse- Standing at 0 minutes  01/04/17 1002 124/83 77 118/84 82 132/86 82  Physical Exam  Constitutional: She is oriented to person, place, and time. She appears well-developed and well-nourished.  HENT:  Head: Normocephalic and atraumatic.  Mouth/Throat: No oropharyngeal exudate.  Eyes: Pupils are equal, round, and reactive to light. Conjunctivae and EOM are normal. No scleral icterus.  Neck: Normal range of motion. Neck supple. No hepatojugular reflux and no JVD present. Carotid bruit is not present. No thyromegaly present.  Cardiovascular: Normal rate, regular rhythm and intact distal pulses.  Exam reveals no gallop and no friction rub.   No murmur heard. Pulmonary/Chest: Effort normal and breath sounds normal. She has  no wheezes. She has no rales. She exhibits no tenderness.  Abdominal: Soft. Bowel sounds are normal. She exhibits no distension. There is no tenderness. There is no rebound.  Musculoskeletal: Normal range of motion. She exhibits no edema.  Lymphadenopathy:    She has no cervical adenopathy.  Neurological: She is alert and oriented to person, place, and time. No cranial nerve deficit.  Skin: Skin is warm and dry. No rash noted. No erythema.  Psychiatric: She has a normal mood and affect. Her behavior is normal. Judgment and thought content normal.  Nursing note and vitals reviewed.    Adult ECG Report  From PCP office visit  Rate: 77 ;  Rhythm: normal sinus rhythm and normal axis, intervals & durations.;   Narrative Interpretation: normal EKG    Other studies Reviewed: Additional studies/ records that were  reviewed today include:  Recent Labs:    Lab Results  Component Value Date   CREATININE 0.83 12/01/2016   BUN 8 12/01/2016   NA 140 12/01/2016   K 4.4 12/01/2016   CL 103 12/01/2016   CO2 23 12/01/2016   Lab Results  Component Value Date   CHOL 191 07/02/2013   HDL 57 07/02/2013   LDLCALC 100 (H) 07/02/2013   TRIG 170 (H) 07/02/2013   CHOLHDL 3.4 07/02/2013   Lab Results  Component Value Date   WBC 6.4 12/01/2016   HGB 13.0 12/01/2016   HCT 39.4 12/01/2016   MCV 91 12/01/2016   PLT 265 12/01/2016    ASSESSMENT / PLAN: Problem List Items Addressed This Visit    Chest pain with moderate risk for cardiac etiology (Chronic)    She has a pretty significant FH of CAD & herself has HLD & is a former smoker. She has CP that seems to be worse with exertion, but is atypical in the sense that it also occurs at rest.  When pressed, she notes that the symptoms were made worse with exertion, but was not sure.  With both typical & atypical features - will evaluate for ischemia with Myoview ST (she recently fell & injured her knee & probably will not be up to walk on a TM any  time soon).  The episodes are also associated with fast HR - will check 48 hr Holter Monitor.         Relevant Orders   Holter monitor - 48 hour   Myocardial Perfusion Imaging   Dizziness (Chronic)    Not sure about these symptoms - could be associated with arrhythmia with increase HR sensation. Check 48 hr Holter Monitor.  Also evaluating for coronary ischemia with Myoview.  If these studies are negative, would consider vestibular dysfunction.  Does not seem orthostatic (relatively normal orthostatic vitals) or vertiginous.      Relevant Orders   Holter monitor - 48 hour   Myocardial Perfusion Imaging   Shortness of breath - Primary (Chronic)    SOB both @ rest & with exertion - not c/w with CHF.  Need to exclude ischemia.  Agree that she will not be able to do GXT with her injured knee -- will order Lexiscan Myoview. Hopefully, this will also give provide an accurate EF assessment.      Relevant Orders   Myocardial Perfusion Imaging      Current medicines are reviewed at length with the patient today. (+/- concerns) n/a The following changes have been made: n/a  Patient Instructions  SCHEDULE AT [address] Your physician has recommended that you wear a holter monitor. Holter monitors are medical devices that record the heart's electrical activity. Doctors most often use these monitors to diagnose arrhythmias. Arrhythmias are problems with the speed or rhythm of the heartbeat. The monitor is a small, portable device. You can wear one while you do your normal daily activities. This is usually used to diagnose what is causing palpitations/syncope (passing out).   SCHEDULE AT  [address] Your physician has requested that you have a lexiscan myoview. For further information please visit [email or web address] Please follow instruction sheet, as given.    Your physician recommends that you schedule a follow-up appointment in 1- 2  MONTHS WITH DR HARDING Follow -up TESTS    Studies Ordered:   Orders Placed This Encounter  Procedures  . Holter monitor - 48 hour  .  Myocardial Perfusion Imaging      Glenetta Hew, M.D., M.S. Interventional Cardiologist   Pager # [phone] Phone # [phone] [address] [place], [place] [address]

## 2017-01-05 ENCOUNTER — Encounter: Payer: Self-pay | Admitting: Cardiology

## 2017-01-05 NOTE — Assessment & Plan Note (Signed)
SOB both @ rest & with exertion - not c/w with CHF.  Need to exclude ischemia.  Agree that she will not be able to do GXT with her injured knee -- will order Lexiscan Myoview. Hopefully, this will also give provide an accurate EF assessment.

## 2017-01-05 NOTE — Assessment & Plan Note (Signed)
She has a pretty significant FH of CAD & herself has HLD & is a former smoker. She has CP that seems to be worse with exertion, but is atypical in the sense that it also occurs at rest.  When pressed, she notes that the symptoms were made worse with exertion, but was not sure.  With both typical & atypical features - will evaluate for ischemia with Myoview ST (she recently fell & injured her knee & probably will not be up to walk on a TM any time soon).  The episodes are also associated with fast HR - will check 48 hr Holter Monitor.

## 2017-01-05 NOTE — Assessment & Plan Note (Addendum)
Not sure about these symptoms - could be associated with arrhythmia with increase HR sensation. Check 48 hr Holter Monitor.  Also evaluating for coronary ischemia with Myoview.  If these studies are negative, would consider vestibular dysfunction.  Does not seem orthostatic (relatively normal orthostatic vitals) or vertiginous.

## 2017-01-18 ENCOUNTER — Telehealth (HOSPITAL_COMMUNITY): Payer: Self-pay

## 2017-01-18 NOTE — Telephone Encounter (Signed)
Encounter complete. 

## 2017-01-20 ENCOUNTER — Ambulatory Visit (HOSPITAL_COMMUNITY)
Admission: RE | Admit: 2017-01-20 | Discharge: 2017-01-20 | Disposition: A | Discharge status: Home / Self Care | Payer: BLUE CROSS/BLUE SHIELD | Source: Ambulatory Visit | Attending: Cardiology | Admitting: Cardiology

## 2017-01-20 DIAGNOSIS — R079 Chest pain, unspecified: Secondary | ICD-10-CM | POA: Diagnosis not present

## 2017-01-20 DIAGNOSIS — R0602 Shortness of breath: Secondary | ICD-10-CM

## 2017-01-20 DIAGNOSIS — R42 Dizziness and giddiness: Secondary | ICD-10-CM | POA: Diagnosis not present

## 2017-01-20 LAB — MYOCARDIAL PERFUSION IMAGING
CHL CUP NUCLEAR SDS: 1
CHL CUP NUCLEAR SRS: 0
CHL CUP NUCLEAR SSS: 1
CSEPPHR: 100 {beats}/min
LV dias vol: 47 mL (ref 46–106)
LV sys vol: 13 mL
Rest HR: 71 {beats}/min
TID: 1.39

## 2017-01-20 MED ORDER — AMINOPHYLLINE 25 MG/ML IV SOLN
75.0000 mg | Freq: Once | INTRAVENOUS | Status: AC
  Administered 2017-01-20: 75 mg via INTRAVENOUS

## 2017-01-20 MED ORDER — TECHNETIUM TC 99M TETROFOSMIN IV KIT
30.4000 | PACK | Freq: Once | INTRAVENOUS | Status: AC | PRN
  Administered 2017-01-20: 30.4 via INTRAVENOUS
  Filled 2017-01-20: qty 31

## 2017-01-20 MED ORDER — TECHNETIUM TC 99M TETROFOSMIN IV KIT
10.7000 | PACK | Freq: Once | INTRAVENOUS | Status: AC | PRN
  Administered 2017-01-20: 10.7 via INTRAVENOUS
  Filled 2017-01-20: qty 11

## 2017-01-20 MED ORDER — REGADENOSON 0.4 MG/5ML IV SOLN
0.4000 mg | Freq: Once | INTRAVENOUS | Status: AC
  Administered 2017-01-20: 0.4 mg via INTRAVENOUS

## 2017-01-21 ENCOUNTER — Telehealth: Payer: Self-pay | Admitting: *Deleted

## 2017-01-21 ENCOUNTER — Telehealth: Payer: Self-pay | Admitting: Cardiology

## 2017-01-21 NOTE — Progress Notes (Signed)
Stress Test looked good!! No sign of significant Heart Artery Disease.  Pump function is normal.  Good news!!.  HARDING,DAVID W, MD 

## 2017-01-21 NOTE — Telephone Encounter (Signed)
-----   Message from Marykay Lex, MD sent at 01/21/2017  2:27 PM EDT ----- Stress Test looked good!! No sign of significant Heart Artery Disease.  Pump function is normal.  Good news!!.  Marykay Lex, MD

## 2017-01-21 NOTE — Telephone Encounter (Signed)
New message     Does she have to have the holter monitor on Monday?  It cost $173 dollars and she just had the stress test done did it show anything

## 2017-01-21 NOTE — Telephone Encounter (Addendum)
Left message to call back- in regarding stress  test

## 2017-01-21 NOTE — Telephone Encounter (Signed)
Returned the phone call to the patient. She stated that the monitor will cost her $177. She would like to know if she can cancel having to have this placed.

## 2017-01-23 NOTE — Telephone Encounter (Signed)
That is up to her -- if the irregular heartbeats & fast heart rates are not worrisome to her, then I would agree with not wearing it.  However, if the symptoms (which were among the symptoms that she was referred for) are concerning, then I would recommend wearing the monitor.  I do not order studies with Cost in mind, instead I order based upon the patient's complaints.  It is up to her if she wears it or not.    Bryan Lemma, MD

## 2017-01-24 NOTE — Telephone Encounter (Signed)
The patient has been made aware of the results and verbalized her understanding.  

## 2017-01-24 NOTE — Telephone Encounter (Signed)
Patient has been made aware of Dr. Elissa Hefty recommendations. She stated that she will not wear the monitor due to cost.

## 2017-01-24 NOTE — Telephone Encounter (Signed)
Left a message to call back.

## 2017-01-24 NOTE — Telephone Encounter (Signed)
Follow up   Pt calling back regarding stress test

## 2017-01-31 ENCOUNTER — Other Ambulatory Visit: Payer: Self-pay | Admitting: Family Medicine

## 2017-02-09 ENCOUNTER — Ambulatory Visit: Payer: BLUE CROSS/BLUE SHIELD | Admitting: Family Medicine

## 2017-02-16 ENCOUNTER — Encounter: Payer: Self-pay | Admitting: Family Medicine

## 2017-02-16 ENCOUNTER — Ambulatory Visit (INDEPENDENT_AMBULATORY_CARE_PROVIDER_SITE_OTHER): Payer: BLUE CROSS/BLUE SHIELD | Admitting: Family Medicine

## 2017-02-16 DIAGNOSIS — Z23 Encounter for immunization: Secondary | ICD-10-CM | POA: Diagnosis not present

## 2017-02-16 DIAGNOSIS — M25562 Pain in left knee: Secondary | ICD-10-CM | POA: Diagnosis not present

## 2017-02-16 DIAGNOSIS — R079 Chest pain, unspecified: Secondary | ICD-10-CM

## 2017-02-16 DIAGNOSIS — R42 Dizziness and giddiness: Secondary | ICD-10-CM | POA: Diagnosis not present

## 2017-02-16 MED ORDER — TRAMADOL HCL 50 MG PO TABS: 50.0000 mg | ORAL_TABLET | Freq: Four times a day (QID) | ORAL | 5 refills | Status: DC | PRN

## 2017-02-16 NOTE — Progress Notes (Signed)
Subjective  Patient is presenting with the following illnesses  DIZZINESS Still have episodes of room spinning but less severe.  o falls.  No hearing loss or visual changes or weakness.  Symptoms not brought on reproducibly by any activity    CHEST PAIN Had cardiac evaluation that was normal.  No chest pain and only mild shortness of breath with exertion now  L KNEE PAIN Improved.  Using it more still some pain but lessing. No soft tissue swelling    Chief Complaint noted Review of Symptoms - see HPI PMH - Smoking status noted.     Objective Vital Signs reviewed Neurologic exam : Cn 2-7 intact Strength equal & normal in upper & lower extremities Able to walk on heels and toes.   Balance normal Romberg sways slightly when eyes closed but does not fall  Heart - Regular rate and rhythm.  No murmurs, gallops or rubs.    L Knee - FROM no effusion mild diffuse pain  Ears:  External ear exam shows no significant lesions or deformities.  Otoscopic examination reveals clear canals, tympanic membranes are intact bilaterally without bulging, retraction, inflammation or discharge. Hearing is grossly normal bilaterall     Assessments/Plans  Acute pain of left knee Improved   Dizziness Now more consistent with vertigo. Consistent with  BPPV.  No red flags for focal CNS disease.  Continue movement   Chest pain with moderate risk for cardiac etiology Improved low likelihood of CAD given normal myoview    See after visit summary for details of patient instuctions

## 2017-02-16 NOTE — Patient Instructions (Signed)
Good to see you today!  Thanks for coming in.  Exercise and try to cause the dizziness as much as you can  If you are not better in 2 months or if is getting worse then come back or call  Keep taking all the medication as you are

## 2017-02-16 NOTE — Assessment & Plan Note (Signed)
Improved

## 2017-02-16 NOTE — Assessment & Plan Note (Signed)
Improved low likelihood of CAD given normal myoview

## 2017-02-16 NOTE — Assessment & Plan Note (Signed)
Now more consistent with vertigo. Consistent with  BPPV.  No red flags for focal CNS disease.  Continue movement

## 2017-03-08 ENCOUNTER — Ambulatory Visit: Payer: BLUE CROSS/BLUE SHIELD | Admitting: Cardiology

## 2017-03-27 ENCOUNTER — Other Ambulatory Visit: Payer: Self-pay | Admitting: Family Medicine

## 2017-06-28 ENCOUNTER — Other Ambulatory Visit: Payer: Self-pay | Admitting: *Deleted

## 2017-06-29 MED ORDER — AMITRIPTYLINE HCL 150 MG PO TABS: ORAL_TABLET | ORAL | 2 refills | Status: DC

## 2017-12-05 ENCOUNTER — Other Ambulatory Visit: Payer: Self-pay | Admitting: Family Medicine

## 2018-01-10 ENCOUNTER — Other Ambulatory Visit: Payer: Self-pay | Admitting: Family Medicine

## 2018-02-22 IMAGING — CR DG TIBIA/FIBULA 2V*L*
4 series · 4 of 4 positions shown · non-contrast
Comparison: None.

CLINICAL DATA: Left knee and lower leg pain after fall down stairs
1 week ago.

EXAM:
LEFT TIBIA AND FIBULA - 2 VIEW

[tibia ap (1 of 2)]
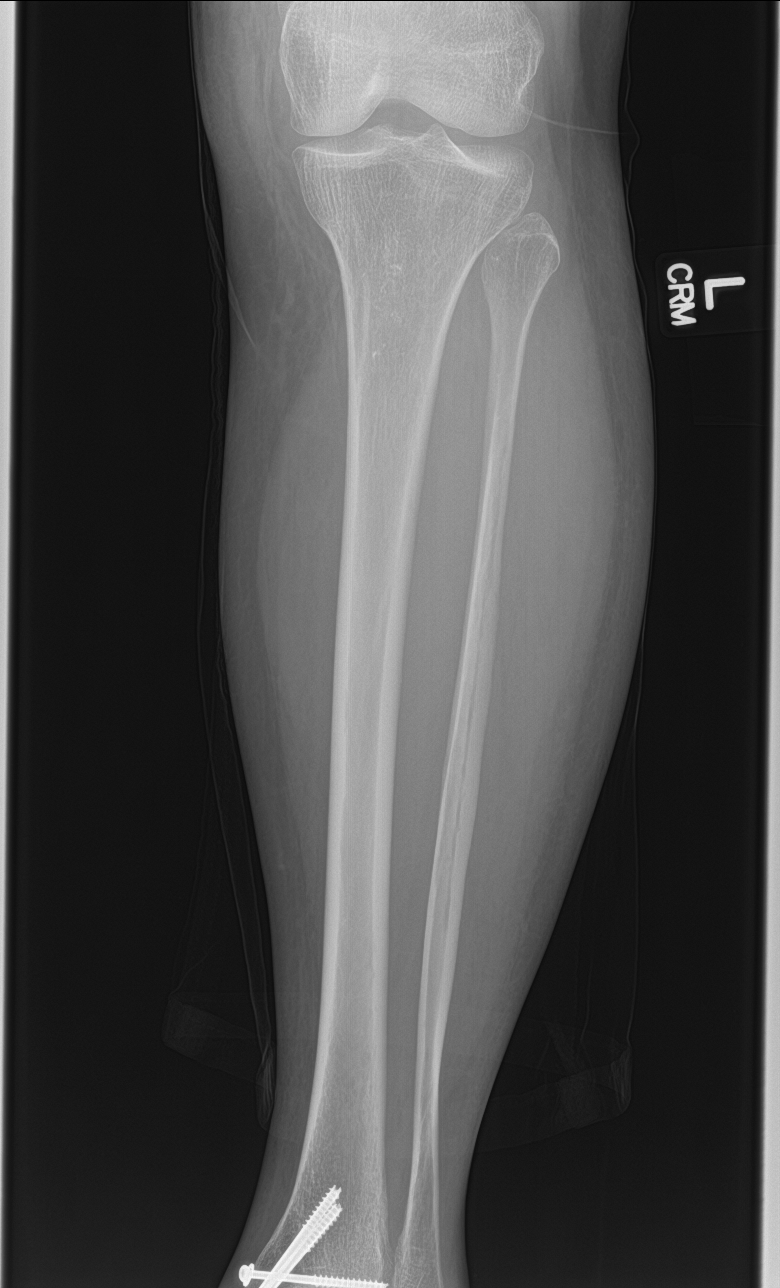

[tibia ap (2 of 2)]
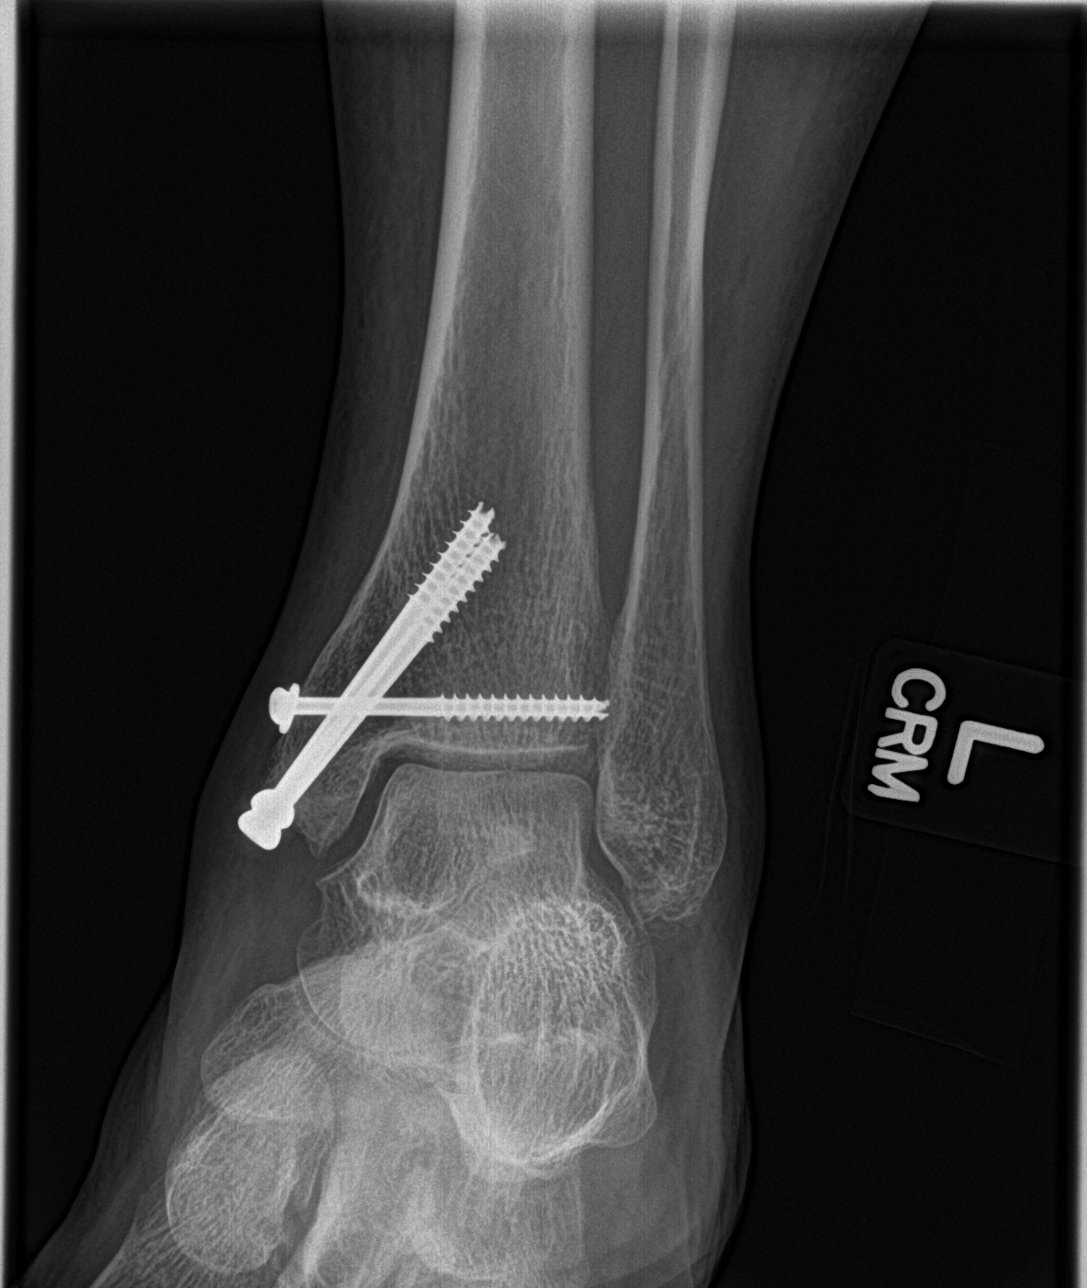

[tibia lat (1 of 2)]
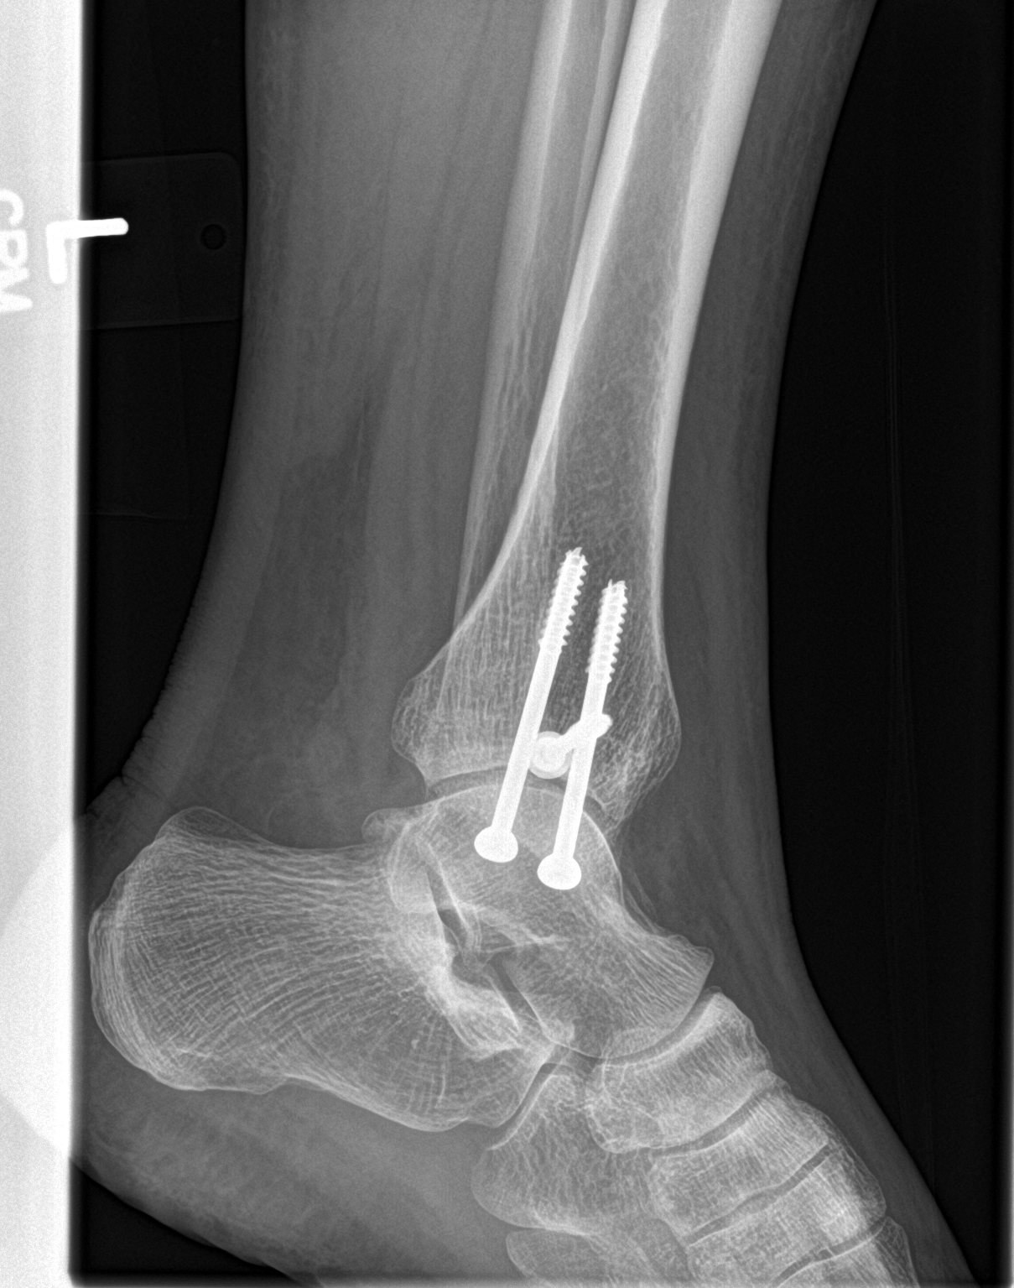

[tibia lat (2 of 2)]
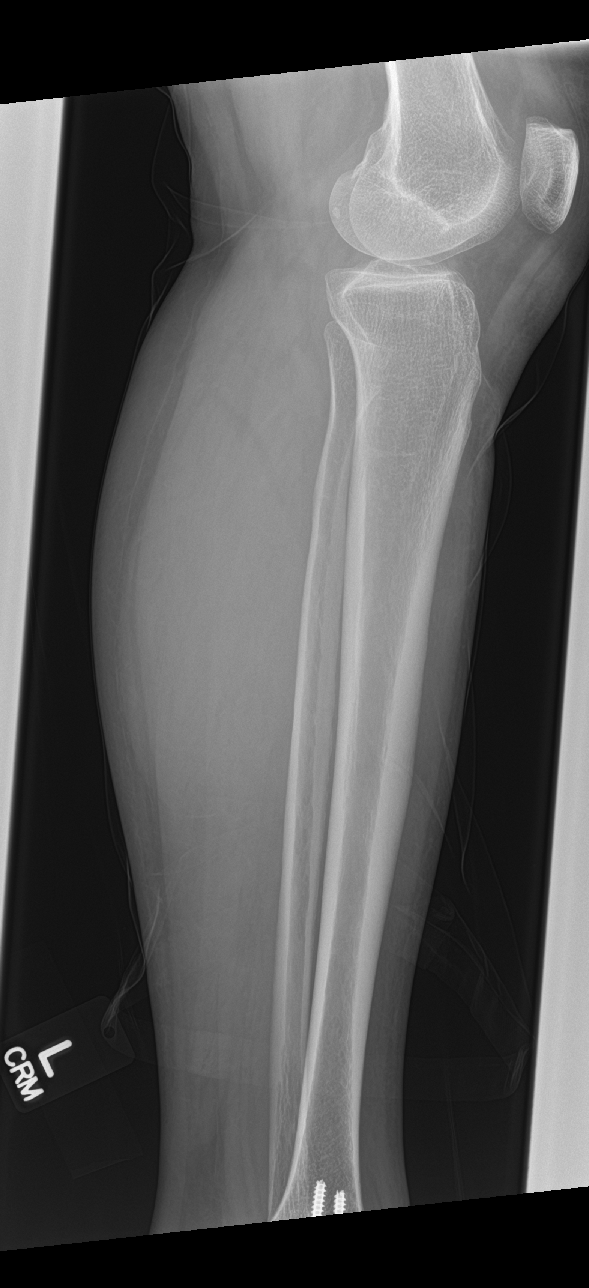

[4 of 4 positions shown; findings below may reference images not displayed]

FINDINGS: There is no evidence of fracture or other focal bone lesions.
Cortical margins of the tibia and fibular intact. Screws traverse
the medial malleolus and distal tibia, hardware is intact. Remote
distal fibular fracture. Mild soft tissue edema.
IMPRESSION: Soft tissue edema without acute fracture.

## 2018-06-27 ENCOUNTER — Other Ambulatory Visit: Payer: Self-pay | Admitting: Family Medicine

## 2018-07-07 ENCOUNTER — Other Ambulatory Visit: Payer: Self-pay | Admitting: Family Medicine

## 2018-08-02 ENCOUNTER — Other Ambulatory Visit: Payer: Self-pay | Admitting: Family Medicine

## 2018-10-27 ENCOUNTER — Other Ambulatory Visit: Payer: Self-pay | Admitting: Family Medicine

## 2018-11-02 ENCOUNTER — Telehealth: Payer: Self-pay

## 2018-11-02 NOTE — Telephone Encounter (Signed)
Attempted to call patient x 3 to schedule an appointment with Dr. Erin Hearing.  No answer and no voice mail.  Kristi Evans, [place]

## 2019-01-23 ENCOUNTER — Other Ambulatory Visit: Payer: Self-pay | Admitting: Family Medicine

## 2019-01-24 ENCOUNTER — Other Ambulatory Visit: Payer: Self-pay | Admitting: Family Medicine

## 2019-02-21 ENCOUNTER — Other Ambulatory Visit: Payer: Self-pay | Admitting: Family Medicine

## 2019-03-13 ENCOUNTER — Other Ambulatory Visit: Payer: Self-pay | Admitting: Family Medicine

## 2019-05-28 ENCOUNTER — Other Ambulatory Visit: Payer: Self-pay | Admitting: Family Medicine

## 2019-10-18 ENCOUNTER — Telehealth: Payer: Self-pay

## 2019-10-18 NOTE — Patient Instructions (Signed)
It was a pleasure speaking with you today.  Dana Allan, MD [facility]

## 2019-10-18 NOTE — Progress Notes (Signed)
[facility] Telemedicine Visit  Patient consented to have virtual visit and was identified by name and date of birth. Method of visit: Telephone  Encounter participants: Patient: Kristi Evans - located at home Provider: Dana Allan - located at office Others (if applicable):   Chief Complaint: wants something for nausea  HPI: Patient reports getting second COVID vaccine 06/09.  She reports headaches, cough and nausea started yesterday. She endorses having had these same symptoms when received the first dose of vaccine.  She reports history of headaches and has Tramadol for pain management.  She is only requesting nausea medication today.  Denies any fever, shortness of breath or nasal congestion.  No photophobia or weakness.  Denies any vomiting or diarrhea.  She endorses more fatigue since yesterday.  She reports that her headache is worse after coughing. Cough is non productive.   ROS: per HPI  Pertinent PMHx:  Migraines Tension Headaches  Exam:  There were no vitals taken for this visit.  Respiratory: No IWOB or SOB.  Able to speak in full sentences.  Intermittent cough.  Assessment/Plan:  Nausea Reports no vomiting.  Similar symptoms with initial COVID vaccine.  Likely side effect of medication.  -Zofran 4 mg q8h x 20 tabs -Offered COVID testing -Strict return precautions if no resolve with medication or worsening of cough, headaches.   -Given lack of ability to assess patient, encouraged to go to ED for further evaluation.  Patient wanted to try nausea medication first.     Time spent during visit with patient:  20 minutes

## 2019-10-18 NOTE — Telephone Encounter (Signed)
Patient calls nurse line regarding headache, cough and nausea. Patient reports receiving second COVID vaccine on 10/10/2019. Patient denies fever, shortness of breath. Patient requesting pain medication for headache and nausea medication. Advised patient that she would most likely need appointment for evaluation of symptoms before medications would be sent in. Scheduled virtual visit with Dr. Clent Ridges tomorrow morning, as [facility] clinic spots were full.   ED precautions given  To PCP and Dr. Georgina Peer, RN

## 2019-10-19 ENCOUNTER — Ambulatory Visit: Payer: BLUE CROSS/BLUE SHIELD | Admitting: Family Medicine

## 2019-10-19 ENCOUNTER — Encounter: Payer: Self-pay | Admitting: Family Medicine

## 2019-10-19 ENCOUNTER — Telehealth (INDEPENDENT_AMBULATORY_CARE_PROVIDER_SITE_OTHER): Payer: BC Managed Care – PPO | Admitting: Family Medicine

## 2019-10-19 ENCOUNTER — Other Ambulatory Visit: Payer: Self-pay

## 2019-10-19 DIAGNOSIS — R519 Headache, unspecified: Secondary | ICD-10-CM

## 2019-10-19 DIAGNOSIS — R110 Nausea: Secondary | ICD-10-CM | POA: Diagnosis not present

## 2019-10-19 DIAGNOSIS — R05 Cough: Secondary | ICD-10-CM | POA: Diagnosis not present

## 2019-10-19 MED ORDER — ONDANSETRON HCL 4 MG PO TABS: 4.0000 mg | ORAL_TABLET | Freq: Three times a day (TID) | ORAL | 0 refills | Status: DC | PRN

## 2019-10-19 NOTE — Assessment & Plan Note (Signed)
Reports no vomiting.  Similar symptoms with initial COVID vaccine.  Likely side effect of medication.  -Zofran 4 mg q8h x 20 tabs -Offered COVID testing -Strict return precautions if no resolve with medication or worsening of cough, headaches.   -Given lack of ability to assess patient, encouraged to go to ED for further evaluation.  Patient wanted to try nausea medication first.

## 2019-10-24 ENCOUNTER — Other Ambulatory Visit: Payer: Self-pay | Admitting: Family Medicine

## 2019-11-29 ENCOUNTER — Other Ambulatory Visit: Payer: Self-pay | Admitting: Family Medicine

## 2019-12-11 ENCOUNTER — Ambulatory Visit: Payer: BC Managed Care – PPO | Admitting: Family Medicine

## 2019-12-11 ENCOUNTER — Other Ambulatory Visit: Payer: Self-pay

## 2019-12-11 ENCOUNTER — Encounter: Payer: Self-pay | Admitting: Family Medicine

## 2019-12-11 VITALS — BP 120/72 | HR 97 | Wt 172.6 lb

## 2019-12-11 DIAGNOSIS — F329 Major depressive disorder, single episode, unspecified: Secondary | ICD-10-CM

## 2019-12-11 DIAGNOSIS — G43909 Migraine, unspecified, not intractable, without status migrainosus: Secondary | ICD-10-CM | POA: Diagnosis not present

## 2019-12-11 DIAGNOSIS — F32A Depression, unspecified: Secondary | ICD-10-CM

## 2019-12-11 DIAGNOSIS — K219 Gastro-esophageal reflux disease without esophagitis: Secondary | ICD-10-CM | POA: Diagnosis not present

## 2019-12-11 DIAGNOSIS — E7800 Pure hypercholesterolemia, unspecified: Secondary | ICD-10-CM

## 2019-12-11 DIAGNOSIS — R600 Localized edema: Secondary | ICD-10-CM

## 2019-12-11 NOTE — Patient Instructions (Signed)
Great to see you today - Thanks for coming in  Keep taking medications as you are  If things slow down we should consider lowering the dose of your amitriptyline   I will call you if your tests are not good.  Otherwise I will send you a letter.  If you do not hear from me with in 2 weeks please call our office.     I will refill all your medications  You need a mammogram to prevent breast cancer.  Please schedule an appointment.  You can call [phone].     Come back to see me in one year

## 2019-12-11 NOTE — Assessment & Plan Note (Signed)
Stable without any concerning symptoms.  Continue twice a day omeprazole

## 2019-12-11 NOTE — Assessment & Plan Note (Signed)
Stable.  Will check labs Continue current medications

## 2019-12-11 NOTE — Progress Notes (Signed)
    SUBJECTIVE:   CHIEF COMPLAINT / HPI:   MIGRAINE - under pretty good control.  Rarely takes a tramadol.  No neurologic symptoms  GERD Has to take ppi twice a day or will get bad heart burn.  No nausea and vomiting or bleeding or weight loss or dsyphagia   DEPRESSION Feels is controlled but does not want to decrease medication.  Has gone through recent stressful family situation part of a chronic condition   PERTINENT  PMH / PSH: she and husband no longer have children living with them   OBJECTIVE:   BP 120/72   Pulse 97   Wt 172 lb 9.6 oz (78.3 kg)   SpO2 96%   BMI 32.61 kg/m   Psych:  Cognition and judgment appear intact. Alert, communicative  and cooperative with normal attention span and concentration. No apparent delusions, illusions, hallucinations Heart - Regular rate and rhythm.  No murmurs, gallops or rubs.    Lungs:  Normal respiratory effort, chest expands symmetrically. Lungs are clear to auscultation, no crackles or wheezes. Extremities:  No cyanosis, edema, or deformity noted with good range of motion of all major joints.   PHQ9 = 2   ASSESSMENT/PLAN:   HYPERCHOLESTEROLEMIA Stable.  Will check labs Continue current medications   Migraine Stable Continue as needed tramadol  Depression Stable continue current medications.  Did discuss possibly weaning in future   GASTROESOPHAGEAL REFLUX, NO ESOPHAGITIS Stable without any concerning symptoms.  Continue twice a day omeprazole      Carney Living, MD [facility]

## 2019-12-11 NOTE — Assessment & Plan Note (Signed)
Stable.  Continue as needed tramadol. 

## 2019-12-11 NOTE — Assessment & Plan Note (Signed)
Stable continue current medications.  Did discuss possibly weaning in future

## 2019-12-12 ENCOUNTER — Encounter: Payer: Self-pay | Admitting: Family Medicine

## 2019-12-12 LAB — CMP14+EGFR
ALT: 37 IU/L — ABNORMAL HIGH (ref 0–32)
AST: 32 IU/L (ref 0–40)
Albumin/Globulin Ratio: 1.8 (ref 1.2–2.2)
Albumin: 4.6 g/dL (ref 3.8–4.8)
Alkaline Phosphatase: 111 IU/L (ref 48–121)
BUN/Creatinine Ratio: 10 — ABNORMAL LOW (ref 12–28)
BUN: 9 mg/dL (ref 8–27)
Bilirubin Total: 0.4 mg/dL (ref 0.0–1.2)
CO2: 23 mmol/L (ref 20–29)
Calcium: 9.7 mg/dL (ref 8.7–10.3)
Chloride: 103 mmol/L (ref 96–106)
Creatinine, Ser: 0.94 mg/dL (ref 0.57–1.00)
GFR calc Af Amer: 75 mL/min/{1.73_m2} (ref >59)
GFR calc non Af Amer: 65 mL/min/{1.73_m2} (ref >59)
Globulin, Total: 2.5 g/dL (ref 1.5–4.5)
Glucose: 105 mg/dL — ABNORMAL HIGH (ref 65–99)
Potassium: 4.6 mmol/L (ref 3.5–5.2)
Sodium: 140 mmol/L (ref 134–144)
Total Protein: 7.1 g/dL (ref 6.0–8.5)

## 2019-12-12 LAB — CBC
Hematocrit: 38.7 % (ref 34.0–46.6)
Hemoglobin: 13 g/dL (ref 11.1–15.9)
MCH: 31 pg (ref 26.6–33.0)
MCHC: 33.6 g/dL (ref 31.5–35.7)
MCV: 92 fL (ref 79–97)
Platelets: 272 10*3/uL (ref 150–450)
RBC: 4.19 x10E6/uL (ref 3.77–5.28)
RDW: 12.8 % (ref 11.7–15.4)
WBC: 5.8 10*3/uL (ref 3.4–10.8)

## 2019-12-12 LAB — LIPID PANEL
Chol/HDL Ratio: 2.9 ratio (ref 0.0–4.4)
Cholesterol, Total: 195 mg/dL (ref 100–199)
HDL: 67 mg/dL (ref >39)
LDL Chol Calc (NIH): 108 mg/dL — ABNORMAL HIGH (ref 0–99)
Triglycerides: 112 mg/dL (ref 0–149)
VLDL Cholesterol Cal: 20 mg/dL (ref 5–40)

## 2019-12-21 ENCOUNTER — Other Ambulatory Visit: Payer: Self-pay | Admitting: Family Medicine

## 2020-01-27 ENCOUNTER — Other Ambulatory Visit: Payer: Self-pay | Admitting: Family Medicine

## 2020-01-28 ENCOUNTER — Other Ambulatory Visit: Payer: Self-pay

## 2020-01-28 MED ORDER — SIMVASTATIN 40 MG PO TABS
ORAL_TABLET | ORAL | 3 refills | Status: DC
Start: 2020-01-28 — End: 2021-02-20

## 2020-06-13 ENCOUNTER — Other Ambulatory Visit: Payer: Self-pay | Admitting: Family Medicine

## 2020-06-18 ENCOUNTER — Ambulatory Visit: Payer: BC Managed Care – PPO | Admitting: Family Medicine

## 2020-06-18 ENCOUNTER — Encounter: Payer: Self-pay | Admitting: Family Medicine

## 2020-06-18 ENCOUNTER — Other Ambulatory Visit: Payer: Self-pay

## 2020-06-18 DIAGNOSIS — R109 Unspecified abdominal pain: Secondary | ICD-10-CM | POA: Insufficient documentation

## 2020-06-18 DIAGNOSIS — N644 Mastodynia: Secondary | ICD-10-CM

## 2020-06-18 DIAGNOSIS — R1011 Right upper quadrant pain: Secondary | ICD-10-CM | POA: Diagnosis not present

## 2020-06-18 NOTE — Assessment & Plan Note (Signed)
History seems to indicate a strain although exam does not confirm.  No indication of organ inflammation or infection.  Will follow for resolution

## 2020-06-18 NOTE — Patient Instructions (Addendum)
Good to see you today!  Thanks for coming in.  For the R sided stomach pain - your liver feels normal.  I think this could be mild irritation of the intestines or muscles.  It should slowly go away.  If any vomiting or bleeding or worsening or has not resolved in 3-4 weeks then let me know  You need a mammogram to prevent breast cancer.  Please schedule an appointment.  You can call [phone].

## 2020-06-18 NOTE — Progress Notes (Signed)
    SUBJECTIVE:   CHIEF COMPLAINT / HPI:   L BREAST TENDERNESS  Felt area under her left breast was tender a few weeks ago.  Seems better now.  Never any mass.  No discharge or adenopathy  R ABDOMEN SORENESS  Tender over the R side of abdomen and lower ribs.  Seemed start when shoveling ice.  No vomiting or bleeding or diarrhea or fever  PERTINENT  PMH / PSH: GERD - stable, Depression stable  OBJECTIVE:   BP 122/72   Pulse (!) 106   Wt 170 lb (77.1 kg)   SpO2 99%   BMI 32.12 kg/m   L Breast:  appear normal, no suspicious masses, no skin or nipple changes or axillary nodes.  No tenderness in indicated spot Abdomen: soft and without masses, organomegaly or hernias noted.  No guarding or rebound.  Is tender diffusely from lower R ribs to RLQ.  No change with heel lift    ASSESSMENT/PLAN:   Pain in the abdomen History seems to indicate a strain although exam does not confirm.  No indication of organ inflammation or infection.  Will follow for resolution  Breast tenderness R side.  Improving by history.  Is due for mammogram      Carney Living, MD [facility]

## 2020-06-18 NOTE — Assessment & Plan Note (Signed)
R side.  Improving by history.  Is due for mammogram

## 2020-07-01 ENCOUNTER — Other Ambulatory Visit: Payer: Self-pay | Admitting: Family Medicine

## 2020-10-30 ENCOUNTER — Other Ambulatory Visit: Payer: Self-pay | Admitting: Family Medicine

## 2021-01-30 ENCOUNTER — Other Ambulatory Visit: Payer: Self-pay | Admitting: *Deleted

## 2021-01-30 NOTE — Telephone Encounter (Signed)
Pt has an appt for 02/25/21 and would like a refill of tramadol until then. To PCP. Jone Baseman, CMA

## 2021-02-02 MED ORDER — TRAMADOL HCL 50 MG PO TABS: 50.0000 mg | ORAL_TABLET | Freq: Four times a day (QID) | ORAL | 1 refills | Status: DC | PRN

## 2021-02-20 ENCOUNTER — Other Ambulatory Visit: Payer: Self-pay | Admitting: Family Medicine

## 2021-02-25 ENCOUNTER — Ambulatory Visit (INDEPENDENT_AMBULATORY_CARE_PROVIDER_SITE_OTHER): Payer: BC Managed Care – PPO | Admitting: Family Medicine

## 2021-02-25 ENCOUNTER — Other Ambulatory Visit: Payer: Self-pay

## 2021-02-25 ENCOUNTER — Encounter: Payer: Self-pay | Admitting: Family Medicine

## 2021-02-25 VITALS — BP 123/80 | HR 97 | Ht 61.0 in | Wt 173.6 lb

## 2021-02-25 DIAGNOSIS — E7800 Pure hypercholesterolemia, unspecified: Secondary | ICD-10-CM

## 2021-02-25 DIAGNOSIS — K219 Gastro-esophageal reflux disease without esophagitis: Secondary | ICD-10-CM

## 2021-02-25 DIAGNOSIS — Z23 Encounter for immunization: Secondary | ICD-10-CM

## 2021-02-25 DIAGNOSIS — G43909 Migraine, unspecified, not intractable, without status migrainosus: Secondary | ICD-10-CM

## 2021-02-25 DIAGNOSIS — F32A Depression, unspecified: Secondary | ICD-10-CM

## 2021-02-25 MED ORDER — AMITRIPTYLINE HCL 100 MG PO TABS: ORAL_TABLET | ORAL | 1 refills | Status: DC

## 2021-02-25 MED ORDER — SIMVASTATIN 40 MG PO TABS: 40.0000 mg | ORAL_TABLET | Freq: Every day | ORAL | 3 refills | Status: DC

## 2021-02-25 MED ORDER — SUMATRIPTAN SUCCINATE 100 MG PO TABS: 100.0000 mg | ORAL_TABLET | ORAL | 0 refills | Status: DC | PRN

## 2021-02-25 MED ORDER — TRAMADOL HCL 50 MG PO TABS: 50.0000 mg | ORAL_TABLET | Freq: Four times a day (QID) | ORAL | 5 refills | Status: DC | PRN

## 2021-02-25 MED ORDER — AMITRIPTYLINE HCL 25 MG PO TABS: 25.0000 mg | ORAL_TABLET | Freq: Every day | ORAL | 0 refills | Status: DC

## 2021-02-25 NOTE — Assessment & Plan Note (Signed)
Stable without red flags.  Continue PPI

## 2021-02-25 NOTE — Assessment & Plan Note (Signed)
Stable.  Will decrease dose of amitriptyline - see after visit summary

## 2021-02-25 NOTE — Assessment & Plan Note (Signed)
Check labs today.

## 2021-02-25 NOTE — Progress Notes (Signed)
    SUBJECTIVE:   CHIEF COMPLAINT / HPI:   Overall feels well.  Mainly here for refills  Headache Continue unchanged.  Uses tramadol when the are bad.  No visual changes or focal weakness.  Takes amitriptylene 150 mg at night for prevention and depression  Depression Overall feels good.  Family is doing well.  Arthritis bothers her some.  GERD About the same.  No nausea and vomiting or bleeding or weight loss.  If does not take PPI at least daily gets worse  L BREAST TENDERNESS  Resolved   PERTINENT  PMH / PSH: Husband Kathlene November has retired.  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  Daughter Alesia Banda is doing well   OBJECTIVE:   BP 123/80   Pulse 97   Ht 5\' 1"  (1.549 m)   Wt 173 lb 9.6 oz (78.7 kg)   SpO2 97%   BMI 32.80 kg/m   Heart - Regular rate and rhythm.  No murmurs, gallops or rubs.    Lungs:  Normal respiratory effort, chest expands symmetrically. Lungs are clear to auscultation, no crackles or wheezes. Shoulder - FROM with mild tenderness.  Tenderness at base of both thumbs.  No redness or soft tissue swelling  Able to stand walk without problems.  Romberg is normal   ASSESSMENT/PLAN:   Depression Stable.  Will decrease dose of amitriptyline - see after visit summary   Migraine Stable.  Trial of imitrex along with ultram.  Lower dose of amitriptylene   GASTROESOPHAGEAL REFLUX, NO ESOPHAGITIS Stable without red flags.  Continue PPI   HYPERCHOLESTEROLEMIA Check labs today      , MD [facility]

## 2021-02-25 NOTE — Patient Instructions (Signed)
Good to see you today - Thank you for coming in  Things we discussed today:  Smaller portions for weight control   Consider walking for 20 minutes every other day   Headaches Need to lower the dose of Amitriptyline to 125 mg at night. Take for 3 months then decrease to 100 mg a night  Try imitrex at the beginning of a bad headache  If your abdominal pain heart burn gets worse let me know   You need a mammogram to prevent breast cancer.  Please schedule an appointment.  You can call [phone].      Please always bring your medication bottles  Come back to see me in 6 months

## 2021-02-25 NOTE — Assessment & Plan Note (Signed)
Stable.  Trial of imitrex along with ultram.  Lower dose of amitriptylene

## 2021-02-26 LAB — LIPID PANEL
Chol/HDL Ratio: 3 ratio (ref 0.0–4.4)
Cholesterol, Total: 203 mg/dL — ABNORMAL HIGH (ref 100–199)
HDL: 67 mg/dL (ref >39)
LDL Chol Calc (NIH): 114 mg/dL — ABNORMAL HIGH (ref 0–99)
Triglycerides: 123 mg/dL (ref 0–149)
VLDL Cholesterol Cal: 22 mg/dL (ref 5–40)

## 2021-03-12 ENCOUNTER — Encounter: Payer: Self-pay | Admitting: Family Medicine

## 2021-06-01 ENCOUNTER — Other Ambulatory Visit: Payer: Self-pay | Admitting: Family Medicine

## 2021-09-01 ENCOUNTER — Other Ambulatory Visit: Payer: Self-pay | Admitting: Family Medicine

## 2021-09-02 ENCOUNTER — Other Ambulatory Visit: Payer: Self-pay | Admitting: Family Medicine

## 2021-10-06 ENCOUNTER — Encounter: Payer: Self-pay | Admitting: *Deleted

## 2021-11-16 DIAGNOSIS — S299XXA Unspecified injury of thorax, initial encounter: Secondary | ICD-10-CM | POA: Diagnosis not present

## 2021-11-16 DIAGNOSIS — Z3202 Encounter for pregnancy test, result negative: Secondary | ICD-10-CM | POA: Diagnosis not present

## 2021-11-16 DIAGNOSIS — T1490XA Injury, unspecified, initial encounter: Secondary | ICD-10-CM | POA: Diagnosis not present

## 2021-11-16 DIAGNOSIS — S52501A Unspecified fracture of the lower end of right radius, initial encounter for closed fracture: Secondary | ICD-10-CM | POA: Diagnosis not present

## 2021-11-16 DIAGNOSIS — S0990XA Unspecified injury of head, initial encounter: Secondary | ICD-10-CM | POA: Diagnosis not present

## 2021-11-16 DIAGNOSIS — S3992XA Unspecified injury of lower back, initial encounter: Secondary | ICD-10-CM | POA: Diagnosis not present

## 2021-11-16 DIAGNOSIS — S3993XA Unspecified injury of pelvis, initial encounter: Secondary | ICD-10-CM | POA: Diagnosis not present

## 2021-11-16 DIAGNOSIS — S52601A Unspecified fracture of lower end of right ulna, initial encounter for closed fracture: Secondary | ICD-10-CM | POA: Diagnosis not present

## 2021-11-16 DIAGNOSIS — R9431 Abnormal electrocardiogram [ECG] [EKG]: Secondary | ICD-10-CM | POA: Diagnosis not present

## 2021-11-16 DIAGNOSIS — S52502A Unspecified fracture of the lower end of left radius, initial encounter for closed fracture: Secondary | ICD-10-CM | POA: Diagnosis not present

## 2021-11-16 DIAGNOSIS — R000 Tachycardia, unspecified: Secondary | ICD-10-CM | POA: Diagnosis not present

## 2021-11-16 DIAGNOSIS — M25531 Pain in right wrist: Secondary | ICD-10-CM | POA: Diagnosis not present

## 2021-11-16 DIAGNOSIS — S199XXA Unspecified injury of neck, initial encounter: Secondary | ICD-10-CM | POA: Diagnosis not present

## 2021-11-16 DIAGNOSIS — S52611A Displaced fracture of right ulna styloid process, initial encounter for closed fracture: Secondary | ICD-10-CM | POA: Diagnosis not present

## 2021-11-16 DIAGNOSIS — S52692A Other fracture of lower end of left ulna, initial encounter for closed fracture: Secondary | ICD-10-CM | POA: Diagnosis not present

## 2021-11-16 DIAGNOSIS — S62102A Fracture of unspecified carpal bone, left wrist, initial encounter for closed fracture: Secondary | ICD-10-CM | POA: Diagnosis not present

## 2021-11-16 DIAGNOSIS — M25512 Pain in left shoulder: Secondary | ICD-10-CM | POA: Diagnosis not present

## 2021-11-16 DIAGNOSIS — S52602A Unspecified fracture of lower end of left ulna, initial encounter for closed fracture: Secondary | ICD-10-CM | POA: Diagnosis not present

## 2021-11-16 DIAGNOSIS — Z8711 Personal history of peptic ulcer disease: Secondary | ICD-10-CM | POA: Diagnosis not present

## 2021-11-16 DIAGNOSIS — G479 Sleep disorder, unspecified: Secondary | ICD-10-CM | POA: Diagnosis not present

## 2021-11-16 DIAGNOSIS — Z79899 Other long term (current) drug therapy: Secondary | ICD-10-CM | POA: Diagnosis not present

## 2021-11-16 DIAGNOSIS — G8911 Acute pain due to trauma: Secondary | ICD-10-CM | POA: Diagnosis not present

## 2021-11-16 DIAGNOSIS — S52592A Other fractures of lower end of left radius, initial encounter for closed fracture: Secondary | ICD-10-CM | POA: Diagnosis not present
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
## 2021-11-24 ENCOUNTER — Telehealth: Payer: Self-pay

## 2021-11-24 ENCOUNTER — Encounter: Payer: Self-pay | Admitting: Family Medicine

## 2021-11-24 ENCOUNTER — Ambulatory Visit (INDEPENDENT_AMBULATORY_CARE_PROVIDER_SITE_OTHER): Payer: Medicare HMO | Admitting: Family Medicine

## 2021-11-24 DIAGNOSIS — S5291XA Unspecified fracture of right forearm, initial encounter for closed fracture: Secondary | ICD-10-CM | POA: Diagnosis not present

## 2021-11-24 DIAGNOSIS — S5292XA Unspecified fracture of left forearm, initial encounter for closed fracture: Secondary | ICD-10-CM | POA: Diagnosis not present

## 2021-11-24 DIAGNOSIS — L237 Allergic contact dermatitis due to plants, except food: Secondary | ICD-10-CM | POA: Diagnosis not present

## 2021-11-24 DIAGNOSIS — S42301A Unspecified fracture of shaft of humerus, right arm, initial encounter for closed fracture: Secondary | ICD-10-CM | POA: Insufficient documentation

## 2021-11-24 DIAGNOSIS — S5290XS Unspecified fracture of unspecified forearm, sequela: Secondary | ICD-10-CM

## 2021-11-24 MED ORDER — TRAMADOL HCL 50 MG PO TABS: 50.0000 mg | ORAL_TABLET | Freq: Four times a day (QID) | ORAL | 0 refills | Status: DC | PRN

## 2021-11-24 MED ORDER — PREDNISONE 20 MG PO TABS: ORAL_TABLET | ORAL | 0 refills | Status: DC

## 2021-11-24 NOTE — Telephone Encounter (Signed)
Called pharmacy and informed of instructions per Dr. Deirdre Priest.   Veronda Prude, RN

## 2021-11-24 NOTE — Patient Instructions (Addendum)
Good to see you today - Thank you for coming in  Things we discussed today:  I have put in a referral to Orthopedics .  They should call you to schedule an appointment.  This could appear as an unknown number on your phone.  If you have not heard from them in 1 weeks please let me know.   Take the prednisone for the poison ivy  If you are not better or have problems getting a referral let me know  Call the orthopedics listed in you insurance   Please always bring your medication bottles  Come back to see me in 3 months

## 2021-11-24 NOTE — Telephone Encounter (Signed)
Please let them know I gave a few extra in case the PI flares after tapering  Thanks  LC

## 2021-11-24 NOTE — Assessment & Plan Note (Signed)
Given diffuse nature will treat with prednisone taper

## 2021-11-24 NOTE — Telephone Encounter (Signed)
Received phone call from pharmacy regarding clarification on prednisone prescription. Current quantity does not match directions.   Pharmacy states that per directions, quantity would be 17. Please clarify if quantity needs to be decreased from 21 to 17 or if directions needs to be changed.   Thanks.   Veronda Prude, RN

## 2021-11-24 NOTE — Assessment & Plan Note (Addendum)
Will refer to ortho for continued care.  She has limited range of motion and strength in L shoulder possible tear.    Will renew tramadol early for her pain

## 2021-11-26 DIAGNOSIS — S52511A Displaced fracture of right radial styloid process, initial encounter for closed fracture: Secondary | ICD-10-CM | POA: Diagnosis not present

## 2021-11-26 DIAGNOSIS — S52352A Displaced comminuted fracture of shaft of radius, left arm, initial encounter for closed fracture: Secondary | ICD-10-CM | POA: Diagnosis not present

## 2021-12-01 DIAGNOSIS — S52121A Displaced fracture of head of right radius, initial encounter for closed fracture: Secondary | ICD-10-CM | POA: Diagnosis not present

## 2021-12-01 DIAGNOSIS — S52122A Displaced fracture of head of left radius, initial encounter for closed fracture: Secondary | ICD-10-CM | POA: Diagnosis not present

## 2021-12-02 DIAGNOSIS — S52572A Other intraarticular fracture of lower end of left radius, initial encounter for closed fracture: Secondary | ICD-10-CM | POA: Diagnosis not present

## 2021-12-02 DIAGNOSIS — S52571A Other intraarticular fracture of lower end of right radius, initial encounter for closed fracture: Secondary | ICD-10-CM | POA: Diagnosis not present

## 2021-12-02 DIAGNOSIS — G8918 Other acute postprocedural pain: Secondary | ICD-10-CM | POA: Diagnosis not present

## 2021-12-17 DIAGNOSIS — M25631 Stiffness of right wrist, not elsewhere classified: Secondary | ICD-10-CM | POA: Diagnosis not present

## 2021-12-17 DIAGNOSIS — S52572D Other intraarticular fracture of lower end of left radius, subsequent encounter for closed fracture with routine healing: Secondary | ICD-10-CM | POA: Diagnosis not present

## 2021-12-17 DIAGNOSIS — S52502D Unspecified fracture of the lower end of left radius, subsequent encounter for closed fracture with routine healing: Secondary | ICD-10-CM | POA: Diagnosis not present

## 2021-12-17 DIAGNOSIS — S52501D Unspecified fracture of the lower end of right radius, subsequent encounter for closed fracture with routine healing: Secondary | ICD-10-CM | POA: Diagnosis not present

## 2021-12-17 DIAGNOSIS — M25632 Stiffness of left wrist, not elsewhere classified: Secondary | ICD-10-CM | POA: Diagnosis not present

## 2021-12-17 DIAGNOSIS — S52571D Other intraarticular fracture of lower end of right radius, subsequent encounter for closed fracture with routine healing: Secondary | ICD-10-CM | POA: Diagnosis not present

## 2021-12-21 DIAGNOSIS — S52501D Unspecified fracture of the lower end of right radius, subsequent encounter for closed fracture with routine healing: Secondary | ICD-10-CM | POA: Diagnosis not present

## 2021-12-21 DIAGNOSIS — S52502D Unspecified fracture of the lower end of left radius, subsequent encounter for closed fracture with routine healing: Secondary | ICD-10-CM | POA: Diagnosis not present

## 2021-12-21 DIAGNOSIS — M25632 Stiffness of left wrist, not elsewhere classified: Secondary | ICD-10-CM | POA: Diagnosis not present

## 2021-12-21 DIAGNOSIS — M25631 Stiffness of right wrist, not elsewhere classified: Secondary | ICD-10-CM | POA: Diagnosis not present

## 2021-12-23 DIAGNOSIS — M25631 Stiffness of right wrist, not elsewhere classified: Secondary | ICD-10-CM | POA: Diagnosis not present

## 2021-12-23 DIAGNOSIS — M25632 Stiffness of left wrist, not elsewhere classified: Secondary | ICD-10-CM | POA: Diagnosis not present

## 2021-12-23 DIAGNOSIS — S52502D Unspecified fracture of the lower end of left radius, subsequent encounter for closed fracture with routine healing: Secondary | ICD-10-CM | POA: Diagnosis not present

## 2021-12-23 DIAGNOSIS — S52501D Unspecified fracture of the lower end of right radius, subsequent encounter for closed fracture with routine healing: Secondary | ICD-10-CM | POA: Diagnosis not present

## 2021-12-28 DIAGNOSIS — S52501D Unspecified fracture of the lower end of right radius, subsequent encounter for closed fracture with routine healing: Secondary | ICD-10-CM | POA: Diagnosis not present

## 2021-12-28 DIAGNOSIS — S52502D Unspecified fracture of the lower end of left radius, subsequent encounter for closed fracture with routine healing: Secondary | ICD-10-CM | POA: Diagnosis not present

## 2021-12-28 DIAGNOSIS — M25631 Stiffness of right wrist, not elsewhere classified: Secondary | ICD-10-CM | POA: Diagnosis not present

## 2021-12-28 DIAGNOSIS — M25632 Stiffness of left wrist, not elsewhere classified: Secondary | ICD-10-CM | POA: Diagnosis not present

## 2021-12-30 DIAGNOSIS — M25631 Stiffness of right wrist, not elsewhere classified: Secondary | ICD-10-CM | POA: Diagnosis not present

## 2021-12-30 DIAGNOSIS — S52502D Unspecified fracture of the lower end of left radius, subsequent encounter for closed fracture with routine healing: Secondary | ICD-10-CM | POA: Diagnosis not present

## 2021-12-30 DIAGNOSIS — S52501D Unspecified fracture of the lower end of right radius, subsequent encounter for closed fracture with routine healing: Secondary | ICD-10-CM | POA: Diagnosis not present

## 2021-12-30 DIAGNOSIS — M25632 Stiffness of left wrist, not elsewhere classified: Secondary | ICD-10-CM | POA: Diagnosis not present

## 2022-01-05 DIAGNOSIS — M25632 Stiffness of left wrist, not elsewhere classified: Secondary | ICD-10-CM | POA: Diagnosis not present

## 2022-01-05 DIAGNOSIS — S52501D Unspecified fracture of the lower end of right radius, subsequent encounter for closed fracture with routine healing: Secondary | ICD-10-CM | POA: Diagnosis not present

## 2022-01-05 DIAGNOSIS — M25631 Stiffness of right wrist, not elsewhere classified: Secondary | ICD-10-CM | POA: Diagnosis not present

## 2022-01-05 DIAGNOSIS — S52502D Unspecified fracture of the lower end of left radius, subsequent encounter for closed fracture with routine healing: Secondary | ICD-10-CM | POA: Diagnosis not present

## 2022-01-07 DIAGNOSIS — M25631 Stiffness of right wrist, not elsewhere classified: Secondary | ICD-10-CM | POA: Diagnosis not present

## 2022-01-07 DIAGNOSIS — M25632 Stiffness of left wrist, not elsewhere classified: Secondary | ICD-10-CM | POA: Diagnosis not present

## 2022-01-07 DIAGNOSIS — S52501D Unspecified fracture of the lower end of right radius, subsequent encounter for closed fracture with routine healing: Secondary | ICD-10-CM | POA: Diagnosis not present

## 2022-01-07 DIAGNOSIS — S52502D Unspecified fracture of the lower end of left radius, subsequent encounter for closed fracture with routine healing: Secondary | ICD-10-CM | POA: Diagnosis not present

## 2022-01-08 ENCOUNTER — Other Ambulatory Visit: Payer: Self-pay | Admitting: Family Medicine

## 2022-01-11 DIAGNOSIS — S52501D Unspecified fracture of the lower end of right radius, subsequent encounter for closed fracture with routine healing: Secondary | ICD-10-CM | POA: Diagnosis not present

## 2022-01-11 DIAGNOSIS — M25631 Stiffness of right wrist, not elsewhere classified: Secondary | ICD-10-CM | POA: Diagnosis not present

## 2022-01-11 DIAGNOSIS — S52502D Unspecified fracture of the lower end of left radius, subsequent encounter for closed fracture with routine healing: Secondary | ICD-10-CM | POA: Diagnosis not present

## 2022-01-11 DIAGNOSIS — M25632 Stiffness of left wrist, not elsewhere classified: Secondary | ICD-10-CM | POA: Diagnosis not present

## 2022-01-13 DIAGNOSIS — M25632 Stiffness of left wrist, not elsewhere classified: Secondary | ICD-10-CM | POA: Diagnosis not present

## 2022-01-13 DIAGNOSIS — S52502D Unspecified fracture of the lower end of left radius, subsequent encounter for closed fracture with routine healing: Secondary | ICD-10-CM | POA: Diagnosis not present

## 2022-01-13 DIAGNOSIS — S52501D Unspecified fracture of the lower end of right radius, subsequent encounter for closed fracture with routine healing: Secondary | ICD-10-CM | POA: Diagnosis not present

## 2022-01-13 DIAGNOSIS — M25631 Stiffness of right wrist, not elsewhere classified: Secondary | ICD-10-CM | POA: Diagnosis not present

## 2022-01-18 DIAGNOSIS — S52501D Unspecified fracture of the lower end of right radius, subsequent encounter for closed fracture with routine healing: Secondary | ICD-10-CM | POA: Diagnosis not present

## 2022-01-18 DIAGNOSIS — S52502D Unspecified fracture of the lower end of left radius, subsequent encounter for closed fracture with routine healing: Secondary | ICD-10-CM | POA: Diagnosis not present

## 2022-01-18 DIAGNOSIS — M25631 Stiffness of right wrist, not elsewhere classified: Secondary | ICD-10-CM | POA: Diagnosis not present

## 2022-01-18 DIAGNOSIS — M25632 Stiffness of left wrist, not elsewhere classified: Secondary | ICD-10-CM | POA: Diagnosis not present

## 2022-01-19 DIAGNOSIS — S52571D Other intraarticular fracture of lower end of right radius, subsequent encounter for closed fracture with routine healing: Secondary | ICD-10-CM | POA: Diagnosis not present

## 2022-01-19 DIAGNOSIS — M25511 Pain in right shoulder: Secondary | ICD-10-CM | POA: Diagnosis not present

## 2022-01-19 DIAGNOSIS — S52572D Other intraarticular fracture of lower end of left radius, subsequent encounter for closed fracture with routine healing: Secondary | ICD-10-CM | POA: Diagnosis not present

## 2022-01-20 DIAGNOSIS — S52502D Unspecified fracture of the lower end of left radius, subsequent encounter for closed fracture with routine healing: Secondary | ICD-10-CM | POA: Diagnosis not present

## 2022-01-20 DIAGNOSIS — M25632 Stiffness of left wrist, not elsewhere classified: Secondary | ICD-10-CM | POA: Diagnosis not present

## 2022-01-20 DIAGNOSIS — M25631 Stiffness of right wrist, not elsewhere classified: Secondary | ICD-10-CM | POA: Diagnosis not present

## 2022-01-20 DIAGNOSIS — S52501D Unspecified fracture of the lower end of right radius, subsequent encounter for closed fracture with routine healing: Secondary | ICD-10-CM | POA: Diagnosis not present

## 2022-01-22 ENCOUNTER — Other Ambulatory Visit: Payer: Self-pay | Admitting: Internal Medicine

## 2022-01-22 DIAGNOSIS — M25511 Pain in right shoulder: Secondary | ICD-10-CM

## 2022-01-26 DIAGNOSIS — S52501D Unspecified fracture of the lower end of right radius, subsequent encounter for closed fracture with routine healing: Secondary | ICD-10-CM | POA: Diagnosis not present

## 2022-01-26 DIAGNOSIS — S52502D Unspecified fracture of the lower end of left radius, subsequent encounter for closed fracture with routine healing: Secondary | ICD-10-CM | POA: Diagnosis not present

## 2022-01-26 DIAGNOSIS — M25632 Stiffness of left wrist, not elsewhere classified: Secondary | ICD-10-CM | POA: Diagnosis not present

## 2022-01-26 DIAGNOSIS — M25631 Stiffness of right wrist, not elsewhere classified: Secondary | ICD-10-CM | POA: Diagnosis not present

## 2022-02-03 DIAGNOSIS — S52501D Unspecified fracture of the lower end of right radius, subsequent encounter for closed fracture with routine healing: Secondary | ICD-10-CM | POA: Diagnosis not present

## 2022-02-03 DIAGNOSIS — S52502D Unspecified fracture of the lower end of left radius, subsequent encounter for closed fracture with routine healing: Secondary | ICD-10-CM | POA: Diagnosis not present

## 2022-02-03 DIAGNOSIS — M25631 Stiffness of right wrist, not elsewhere classified: Secondary | ICD-10-CM | POA: Diagnosis not present

## 2022-02-03 DIAGNOSIS — M25632 Stiffness of left wrist, not elsewhere classified: Secondary | ICD-10-CM | POA: Diagnosis not present

## 2022-02-05 DIAGNOSIS — M25631 Stiffness of right wrist, not elsewhere classified: Secondary | ICD-10-CM | POA: Diagnosis not present

## 2022-02-05 DIAGNOSIS — M25632 Stiffness of left wrist, not elsewhere classified: Secondary | ICD-10-CM | POA: Diagnosis not present

## 2022-02-05 DIAGNOSIS — S52501D Unspecified fracture of the lower end of right radius, subsequent encounter for closed fracture with routine healing: Secondary | ICD-10-CM | POA: Diagnosis not present

## 2022-02-05 DIAGNOSIS — S52502D Unspecified fracture of the lower end of left radius, subsequent encounter for closed fracture with routine healing: Secondary | ICD-10-CM | POA: Diagnosis not present

## 2022-02-09 ENCOUNTER — Ambulatory Visit
Admission: RE | Admit: 2022-02-09 | Discharge: 2022-02-09 | Disposition: A | Discharge status: Home / Self Care | Payer: No Typology Code available for payment source | Source: Ambulatory Visit | Attending: Internal Medicine | Admitting: Internal Medicine

## 2022-02-09 DIAGNOSIS — M25511 Pain in right shoulder: Secondary | ICD-10-CM

## 2022-02-10 ENCOUNTER — Ambulatory Visit (INDEPENDENT_AMBULATORY_CARE_PROVIDER_SITE_OTHER): Payer: Medicare HMO | Admitting: Family Medicine

## 2022-02-10 VITALS — BP 132/88 | HR 88 | Ht 61.0 in | Wt 164.2 lb

## 2022-02-10 DIAGNOSIS — E2839 Other primary ovarian failure: Secondary | ICD-10-CM | POA: Diagnosis not present

## 2022-02-10 DIAGNOSIS — Z23 Encounter for immunization: Secondary | ICD-10-CM

## 2022-02-10 DIAGNOSIS — K219 Gastro-esophageal reflux disease without esophagitis: Secondary | ICD-10-CM | POA: Diagnosis not present

## 2022-02-10 DIAGNOSIS — G43909 Migraine, unspecified, not intractable, without status migrainosus: Secondary | ICD-10-CM

## 2022-02-10 MED ORDER — SUMATRIPTAN SUCCINATE 100 MG PO TABS: 100.0000 mg | ORAL_TABLET | ORAL | 0 refills | Status: DC | PRN

## 2022-02-10 NOTE — Patient Instructions (Signed)
Good to see you today - Thank you for coming in  Things we discussed today:  Tooth - call me if you want to start antibiotics  Headache - try the imitrex to see if helps with the headache  I ordered your bone density scan today to check your bone health. [facility] to schedule.    Please always bring your medication bottles  Come back to see me in 6 months
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
## 2022-02-10 NOTE — Assessment & Plan Note (Signed)
Not as well controlled.  Asked her to try the imitrex to see if helps.

## 2022-02-10 NOTE — Assessment & Plan Note (Signed)
Stable.  Continue PPI. 

## 2022-02-11 ENCOUNTER — Encounter: Payer: Self-pay | Admitting: Family Medicine

## 2022-02-11 ENCOUNTER — Telehealth: Payer: Self-pay

## 2022-02-11 DIAGNOSIS — S52501D Unspecified fracture of the lower end of right radius, subsequent encounter for closed fracture with routine healing: Secondary | ICD-10-CM | POA: Diagnosis not present

## 2022-02-11 DIAGNOSIS — M25632 Stiffness of left wrist, not elsewhere classified: Secondary | ICD-10-CM | POA: Diagnosis not present

## 2022-02-11 DIAGNOSIS — S52502D Unspecified fracture of the lower end of left radius, subsequent encounter for closed fracture with routine healing: Secondary | ICD-10-CM | POA: Diagnosis not present

## 2022-02-11 DIAGNOSIS — M25631 Stiffness of right wrist, not elsewhere classified: Secondary | ICD-10-CM | POA: Diagnosis not present

## 2022-02-11 MED ORDER — AMOXICILLIN 500 MG PO CAPS: 500.0000 mg | ORAL_CAPSULE | Freq: Three times a day (TID) | ORAL | 0 refills | Status: AC

## 2022-02-11 NOTE — Telephone Encounter (Signed)
Patient calls nurse line requesting antibiotic for dental pain. She reports that she went to the dentist today, however, they are unable to see her until 10/18. They will not prescribe any medications for her until this appointment.   Patient is requesting that medication be sent to [facility] on [address].   Talbot Grumbling, RN

## 2022-02-11 NOTE — Telephone Encounter (Signed)
See MyChart message. Rx sent.

## 2022-02-12 NOTE — Telephone Encounter (Signed)
Patient calls nurse line regarding tooth concern. She states that she has been using OTC pain medication, however, is requesting prescription for pain management from provider.   Will forward request to Dr. Erin Hearing.   Talbot Grumbling, RN

## 2022-02-15 DIAGNOSIS — S52502D Unspecified fracture of the lower end of left radius, subsequent encounter for closed fracture with routine healing: Secondary | ICD-10-CM | POA: Diagnosis not present

## 2022-02-15 DIAGNOSIS — S52501D Unspecified fracture of the lower end of right radius, subsequent encounter for closed fracture with routine healing: Secondary | ICD-10-CM | POA: Diagnosis not present

## 2022-02-15 DIAGNOSIS — M25631 Stiffness of right wrist, not elsewhere classified: Secondary | ICD-10-CM | POA: Diagnosis not present

## 2022-02-15 DIAGNOSIS — M25632 Stiffness of left wrist, not elsewhere classified: Secondary | ICD-10-CM | POA: Diagnosis not present

## 2022-02-15 NOTE — Telephone Encounter (Signed)
Spoke with her Is feeling well no pain on antibiotics Has dental appointment

## 2022-02-17 DIAGNOSIS — S52501D Unspecified fracture of the lower end of right radius, subsequent encounter for closed fracture with routine healing: Secondary | ICD-10-CM | POA: Diagnosis not present

## 2022-02-17 DIAGNOSIS — S52502D Unspecified fracture of the lower end of left radius, subsequent encounter for closed fracture with routine healing: Secondary | ICD-10-CM | POA: Diagnosis not present

## 2022-02-17 DIAGNOSIS — M25632 Stiffness of left wrist, not elsewhere classified: Secondary | ICD-10-CM | POA: Diagnosis not present

## 2022-02-17 DIAGNOSIS — M25631 Stiffness of right wrist, not elsewhere classified: Secondary | ICD-10-CM | POA: Diagnosis not present

## 2022-02-18 DIAGNOSIS — M65331 Trigger finger, right middle finger: Secondary | ICD-10-CM | POA: Diagnosis not present

## 2022-02-18 DIAGNOSIS — S52512D Displaced fracture of left radial styloid process, subsequent encounter for closed fracture with routine healing: Secondary | ICD-10-CM | POA: Diagnosis not present

## 2022-02-18 DIAGNOSIS — M65321 Trigger finger, right index finger: Secondary | ICD-10-CM | POA: Diagnosis not present

## 2022-02-18 DIAGNOSIS — S52511D Displaced fracture of right radial styloid process, subsequent encounter for closed fracture with routine healing: Secondary | ICD-10-CM | POA: Diagnosis not present

## 2022-02-19 ENCOUNTER — Other Ambulatory Visit: Payer: Self-pay | Admitting: Family Medicine

## 2022-02-23 DIAGNOSIS — S52501D Unspecified fracture of the lower end of right radius, subsequent encounter for closed fracture with routine healing: Secondary | ICD-10-CM | POA: Diagnosis not present

## 2022-02-23 DIAGNOSIS — M25632 Stiffness of left wrist, not elsewhere classified: Secondary | ICD-10-CM | POA: Diagnosis not present

## 2022-02-23 DIAGNOSIS — M25631 Stiffness of right wrist, not elsewhere classified: Secondary | ICD-10-CM | POA: Diagnosis not present

## 2022-02-23 DIAGNOSIS — S52502D Unspecified fracture of the lower end of left radius, subsequent encounter for closed fracture with routine healing: Secondary | ICD-10-CM | POA: Diagnosis not present

## 2022-02-25 DIAGNOSIS — S52501D Unspecified fracture of the lower end of right radius, subsequent encounter for closed fracture with routine healing: Secondary | ICD-10-CM | POA: Diagnosis not present

## 2022-02-25 DIAGNOSIS — M25631 Stiffness of right wrist, not elsewhere classified: Secondary | ICD-10-CM | POA: Diagnosis not present

## 2022-02-25 DIAGNOSIS — S52502D Unspecified fracture of the lower end of left radius, subsequent encounter for closed fracture with routine healing: Secondary | ICD-10-CM | POA: Diagnosis not present

## 2022-02-25 DIAGNOSIS — M25632 Stiffness of left wrist, not elsewhere classified: Secondary | ICD-10-CM | POA: Diagnosis not present

## 2022-03-01 ENCOUNTER — Other Ambulatory Visit: Payer: Self-pay | Admitting: Family Medicine

## 2022-03-01 DIAGNOSIS — M25631 Stiffness of right wrist, not elsewhere classified: Secondary | ICD-10-CM | POA: Diagnosis not present

## 2022-03-01 DIAGNOSIS — S52501D Unspecified fracture of the lower end of right radius, subsequent encounter for closed fracture with routine healing: Secondary | ICD-10-CM | POA: Diagnosis not present

## 2022-03-01 DIAGNOSIS — E7800 Pure hypercholesterolemia, unspecified: Secondary | ICD-10-CM

## 2022-03-01 DIAGNOSIS — G43909 Migraine, unspecified, not intractable, without status migrainosus: Secondary | ICD-10-CM

## 2022-03-01 DIAGNOSIS — M25632 Stiffness of left wrist, not elsewhere classified: Secondary | ICD-10-CM | POA: Diagnosis not present

## 2022-03-01 DIAGNOSIS — S52502D Unspecified fracture of the lower end of left radius, subsequent encounter for closed fracture with routine healing: Secondary | ICD-10-CM | POA: Diagnosis not present

## 2022-03-04 DIAGNOSIS — S52501D Unspecified fracture of the lower end of right radius, subsequent encounter for closed fracture with routine healing: Secondary | ICD-10-CM | POA: Diagnosis not present

## 2022-03-04 DIAGNOSIS — S52502D Unspecified fracture of the lower end of left radius, subsequent encounter for closed fracture with routine healing: Secondary | ICD-10-CM | POA: Diagnosis not present

## 2022-03-04 DIAGNOSIS — M25631 Stiffness of right wrist, not elsewhere classified: Secondary | ICD-10-CM | POA: Diagnosis not present

## 2022-03-04 DIAGNOSIS — M25632 Stiffness of left wrist, not elsewhere classified: Secondary | ICD-10-CM | POA: Diagnosis not present

## 2022-03-08 DIAGNOSIS — M25631 Stiffness of right wrist, not elsewhere classified: Secondary | ICD-10-CM | POA: Diagnosis not present

## 2022-03-08 DIAGNOSIS — M25632 Stiffness of left wrist, not elsewhere classified: Secondary | ICD-10-CM | POA: Diagnosis not present

## 2022-03-08 DIAGNOSIS — S52502D Unspecified fracture of the lower end of left radius, subsequent encounter for closed fracture with routine healing: Secondary | ICD-10-CM | POA: Diagnosis not present

## 2022-03-08 DIAGNOSIS — S52501D Unspecified fracture of the lower end of right radius, subsequent encounter for closed fracture with routine healing: Secondary | ICD-10-CM | POA: Diagnosis not present

## 2022-03-11 DIAGNOSIS — S52502D Unspecified fracture of the lower end of left radius, subsequent encounter for closed fracture with routine healing: Secondary | ICD-10-CM | POA: Diagnosis not present

## 2022-03-11 DIAGNOSIS — S52501D Unspecified fracture of the lower end of right radius, subsequent encounter for closed fracture with routine healing: Secondary | ICD-10-CM | POA: Diagnosis not present

## 2022-03-11 DIAGNOSIS — M25631 Stiffness of right wrist, not elsewhere classified: Secondary | ICD-10-CM | POA: Diagnosis not present

## 2022-03-11 DIAGNOSIS — M25632 Stiffness of left wrist, not elsewhere classified: Secondary | ICD-10-CM | POA: Diagnosis not present

## 2022-03-15 DIAGNOSIS — M25632 Stiffness of left wrist, not elsewhere classified: Secondary | ICD-10-CM | POA: Diagnosis not present

## 2022-03-15 DIAGNOSIS — S52502D Unspecified fracture of the lower end of left radius, subsequent encounter for closed fracture with routine healing: Secondary | ICD-10-CM | POA: Diagnosis not present

## 2022-03-15 DIAGNOSIS — S52501D Unspecified fracture of the lower end of right radius, subsequent encounter for closed fracture with routine healing: Secondary | ICD-10-CM | POA: Diagnosis not present

## 2022-03-15 DIAGNOSIS — M25631 Stiffness of right wrist, not elsewhere classified: Secondary | ICD-10-CM | POA: Diagnosis not present

## 2022-03-17 DIAGNOSIS — S52502D Unspecified fracture of the lower end of left radius, subsequent encounter for closed fracture with routine healing: Secondary | ICD-10-CM | POA: Diagnosis not present

## 2022-03-17 DIAGNOSIS — M25631 Stiffness of right wrist, not elsewhere classified: Secondary | ICD-10-CM | POA: Diagnosis not present

## 2022-03-17 DIAGNOSIS — S52501D Unspecified fracture of the lower end of right radius, subsequent encounter for closed fracture with routine healing: Secondary | ICD-10-CM | POA: Diagnosis not present

## 2022-03-17 DIAGNOSIS — M25632 Stiffness of left wrist, not elsewhere classified: Secondary | ICD-10-CM | POA: Diagnosis not present

## 2022-03-18 ENCOUNTER — Ambulatory Visit
Admission: RE | Admit: 2022-03-18 | Discharge: 2022-03-18 | Disposition: A | Discharge status: Home / Self Care | Payer: Medicare HMO | Source: Ambulatory Visit | Attending: Family Medicine | Admitting: Family Medicine

## 2022-03-18 DIAGNOSIS — E2839 Other primary ovarian failure: Secondary | ICD-10-CM

## 2022-03-23 DIAGNOSIS — M25532 Pain in left wrist: Secondary | ICD-10-CM | POA: Diagnosis not present

## 2022-03-23 DIAGNOSIS — M25531 Pain in right wrist: Secondary | ICD-10-CM | POA: Diagnosis not present

## 2022-03-30 DIAGNOSIS — M25631 Stiffness of right wrist, not elsewhere classified: Secondary | ICD-10-CM | POA: Diagnosis not present

## 2022-03-30 DIAGNOSIS — S52502D Unspecified fracture of the lower end of left radius, subsequent encounter for closed fracture with routine healing: Secondary | ICD-10-CM | POA: Diagnosis not present

## 2022-03-30 DIAGNOSIS — M25632 Stiffness of left wrist, not elsewhere classified: Secondary | ICD-10-CM | POA: Diagnosis not present

## 2022-04-02 DIAGNOSIS — S52501D Unspecified fracture of the lower end of right radius, subsequent encounter for closed fracture with routine healing: Secondary | ICD-10-CM | POA: Diagnosis not present

## 2022-04-02 DIAGNOSIS — S52502D Unspecified fracture of the lower end of left radius, subsequent encounter for closed fracture with routine healing: Secondary | ICD-10-CM | POA: Diagnosis not present

## 2022-04-02 DIAGNOSIS — M25632 Stiffness of left wrist, not elsewhere classified: Secondary | ICD-10-CM | POA: Diagnosis not present

## 2022-04-02 DIAGNOSIS — M25631 Stiffness of right wrist, not elsewhere classified: Secondary | ICD-10-CM | POA: Diagnosis not present

## 2022-04-06 DIAGNOSIS — M25632 Stiffness of left wrist, not elsewhere classified: Secondary | ICD-10-CM | POA: Diagnosis not present

## 2022-04-06 DIAGNOSIS — M25631 Stiffness of right wrist, not elsewhere classified: Secondary | ICD-10-CM | POA: Diagnosis not present

## 2022-04-06 DIAGNOSIS — S52501D Unspecified fracture of the lower end of right radius, subsequent encounter for closed fracture with routine healing: Secondary | ICD-10-CM | POA: Diagnosis not present

## 2022-04-06 DIAGNOSIS — S52502D Unspecified fracture of the lower end of left radius, subsequent encounter for closed fracture with routine healing: Secondary | ICD-10-CM | POA: Diagnosis not present

## 2022-04-20 DIAGNOSIS — S52502D Unspecified fracture of the lower end of left radius, subsequent encounter for closed fracture with routine healing: Secondary | ICD-10-CM | POA: Diagnosis not present

## 2022-04-20 DIAGNOSIS — M25632 Stiffness of left wrist, not elsewhere classified: Secondary | ICD-10-CM | POA: Diagnosis not present

## 2022-04-20 DIAGNOSIS — M25631 Stiffness of right wrist, not elsewhere classified: Secondary | ICD-10-CM | POA: Diagnosis not present

## 2022-04-20 DIAGNOSIS — S52501D Unspecified fracture of the lower end of right radius, subsequent encounter for closed fracture with routine healing: Secondary | ICD-10-CM | POA: Diagnosis not present

## 2022-05-06 DIAGNOSIS — S52512D Displaced fracture of left radial styloid process, subsequent encounter for closed fracture with routine healing: Secondary | ICD-10-CM | POA: Diagnosis not present

## 2022-05-06 DIAGNOSIS — S52511D Displaced fracture of right radial styloid process, subsequent encounter for closed fracture with routine healing: Secondary | ICD-10-CM | POA: Diagnosis not present

## 2022-05-06 DIAGNOSIS — M65321 Trigger finger, right index finger: Secondary | ICD-10-CM | POA: Diagnosis not present

## 2022-05-06 DIAGNOSIS — M65331 Trigger finger, right middle finger: Secondary | ICD-10-CM | POA: Diagnosis not present

## 2022-07-04 ENCOUNTER — Other Ambulatory Visit: Payer: Self-pay | Admitting: Family Medicine

## 2022-08-09 ENCOUNTER — Other Ambulatory Visit: Payer: Self-pay | Admitting: Family Medicine

## 2022-11-08 DIAGNOSIS — H5203 Hypermetropia, bilateral: Secondary | ICD-10-CM | POA: Diagnosis not present

## 2022-11-09 DIAGNOSIS — H524 Presbyopia: Secondary | ICD-10-CM | POA: Diagnosis not present

## 2022-11-18 NOTE — Progress Notes (Cosign Needed Addendum)
    SUBJECTIVE:   CHIEF COMPLAINT / HPI:   Cough  Worsening cough with green-yellow sputum production x 2 weeks. Getting into coughing fits that are associated with some SOB. Denies fever but states she has felt hot. Feels like mucus is just stuck in her chest. Using OTC cough medicine without relief. Remote history or smoking 15 years ago, none recently. Daughter is also sick with similar symptoms. Denies h/o asthma or COPD.  PERTINENT  PMH / PSH: GERD, HLD  OBJECTIVE:   BP 134/78   Pulse 100   Ht 5\' 1"  (1.549 m)   Wt 164 lb 9.6 oz (74.7 kg)   SpO2 98%   BMI 31.10 kg/m    General: NAD, pleasant, able to participate in exam HEENT: Fluid behind TM bilaterally. Non-bulging or erythematous TM. Erythematous pharynx. Nasal congestion noted. Hoarse voice. Cardiac: RRR, no murmurs. Respiratory: CTAB, normal effort, No wheezes, rales or rhonchi. Intermittent deep cough Abdomen: non-tender, nondistended Extremities: no edema or cyanosis. Skin: warm and dry, no rashes noted  ASSESSMENT/PLAN:   CAP (community acquired pneumonia) Worsening productive cough with green sputum production x 2 weeks. Denies h/o asthma and COPD. Non-focal breath sounds but given symptom severity and progression will treat like CAP. CXR unlikely to change management, will consider if not improving. -Azithromycin and Cefdinir course -Mucinex BID -ED precautions given and consider CXR if not improving after abx course   Dr. Elberta Fortis, DO [facility]

## 2022-11-19 ENCOUNTER — Ambulatory Visit (INDEPENDENT_AMBULATORY_CARE_PROVIDER_SITE_OTHER): Payer: Medicare HMO | Admitting: Family Medicine

## 2022-11-19 ENCOUNTER — Other Ambulatory Visit: Payer: Self-pay | Admitting: Family Medicine

## 2022-11-19 VITALS — BP 134/78 | HR 100 | Ht 61.0 in | Wt 164.6 lb

## 2022-11-19 DIAGNOSIS — G43909 Migraine, unspecified, not intractable, without status migrainosus: Secondary | ICD-10-CM

## 2022-11-19 DIAGNOSIS — J189 Pneumonia, unspecified organism: Secondary | ICD-10-CM | POA: Insufficient documentation

## 2022-11-19 MED ORDER — GUAIFENESIN ER 600 MG PO TB12: 1200.0000 mg | ORAL_TABLET | Freq: Two times a day (BID) | ORAL | 0 refills | Status: AC

## 2022-11-19 MED ORDER — AZITHROMYCIN 500 MG PO TABS: 500.0000 mg | ORAL_TABLET | Freq: Every day | ORAL | 0 refills | Status: AC

## 2022-11-19 MED ORDER — CEFDINIR 300 MG PO CAPS: 300.0000 mg | ORAL_CAPSULE | Freq: Two times a day (BID) | ORAL | 0 refills | Status: AC

## 2022-11-19 NOTE — Assessment & Plan Note (Addendum)
Worsening productive cough with green sputum production x 2 weeks. Denies h/o asthma and COPD. Non-focal breath sounds but given symptom severity and progression will treat like CAP. CXR unlikely to change management, will consider if not improving. -Azithromycin and Cefdinir course -Mucinex BID -ED precautions given and consider CXR if not improving after abx course

## 2022-11-19 NOTE — Patient Instructions (Signed)
It was wonderful to see you today! Thank you for choosing [facility].   Please bring ALL of your medications with you to every visit.   Today we talked about:  Please take both antibiotics every day for the next 5 days. The Cefdinir is twice per day and the Azithromycin is once per day. I also would like you to take the mucus relief twice per day for the same time period.  Please follow up as needed for worsening symptoms  Call the clinic at [phone] if your symptoms worsen or you have any concerns.  Please be sure to schedule follow up at the front desk before you leave today.   Elberta Fortis, DO Family Medicine

## 2022-12-20 ENCOUNTER — Ambulatory Visit (INDEPENDENT_AMBULATORY_CARE_PROVIDER_SITE_OTHER): Payer: Medicare HMO

## 2022-12-20 ENCOUNTER — Encounter: Payer: Self-pay | Admitting: Pharmacist

## 2022-12-20 VITALS — Ht 61.0 in | Wt 164.0 lb

## 2022-12-20 DIAGNOSIS — Z0000 Encounter for general adult medical examination without abnormal findings: Secondary | ICD-10-CM

## 2022-12-20 NOTE — Progress Notes (Signed)
Adherence check-in. Appears adherent as simvastatin last filled for 90 day supply on 11/19/22.

## 2022-12-20 NOTE — Progress Notes (Addendum)
Subjective:   Kristi Evans is a 67 y.o. female who presents for an Initial Medicare Annual Wellness Visit.  Visit Complete: Virtual  I connected with  Viann Shove on 12/20/22 by a audio enabled telemedicine application and verified that I am speaking with the correct person using two identifiers.  Patient Location: Home  Provider Location: Home Office  I discussed the limitations of evaluation and management by telemedicine. The patient expressed understanding and agreed to proceed.  Vital Signs: Because this visit was a virtual/telehealth visit, some criteria may be missing or patient reported. Any vitals not documented were not able to be obtained and vitals that have been documented are patient reported.   Review of Systems     Cardiac Risk Factors include: advanced age (>18men, >31 women);dyslipidemia     Objective:    Today's Vitals   12/20/22 1646  Weight: 164 lb (74.4 kg)  Height: 5\' 1"  (1.549 m)   Body mass index is 30.99 kg/m.     12/20/2022    6:07 PM 06/18/2020    9:32 AM 12/11/2019   11:10 AM 02/16/2017    9:46 AM 12/22/2016    9:24 AM 12/20/2016    9:52 AM 12/01/2016    8:53 AM  Advanced Directives  Does Patient Have a Medical Advance Directive? No No No No No No No  Would patient like information on creating a medical advance directive? Yes (MAU/Ambulatory/Procedural Areas - Information given) No - Patient declined No - Patient declined No - Patient declined No - Patient declined No - Patient declined No - Patient declined    Current Medications (verified) Outpatient Encounter Medications as of 12/20/2022  Medication Sig   amitriptyline (ELAVIL) 100 MG tablet Take 1 tablet by mouth nightly   Multiple Vitamin (MULTIVITAMIN WITH MINERALS) TABS tablet Take 1 tablet by mouth daily.   omeprazole (PRILOSEC) 20 MG capsule TAKE 1 CAPSULE TWICE DAILY   simvastatin (ZOCOR) 40 MG tablet Take 1 tablet by mouth once daily   SUMAtriptan (IMITREX) 100 MG tablet  Take 1 tablet (100 mg total) by mouth every 2 (two) hours as needed for migraine. May repeat in 2 hours if headache persists or recurs.   traMADol (ULTRAM) 50 MG tablet TAKE 1 TABLET BY MOUTH EVERY 6 HOURS AS NEEDED   No facility-administered encounter medications on file as of 12/20/2022.    Allergies (verified) Aspirin and Codeine phosphate   History: Past Medical History:  Diagnosis Date   GERD (gastroesophageal reflux disease)    Hyperlipidemia    Past Surgical History:  Procedure Laterality Date   ABDOMINAL HYSTERECTOMY  2007   ANKLE SURGERY     COLONOSCOPY  2004   UPPER GASTROINTESTINAL ENDOSCOPY  2004   Family History  Problem Relation Age of Onset   Stroke Mother    Heart disease Father    Heart disease Maternal Grandmother    Heart disease Maternal Grandfather    Heart disease Paternal Grandmother    Heart disease Paternal Grandfather    Colon cancer Neg Hx    Social History   Socioeconomic History   Marital status: Married    Spouse name: Not on file   Number of children: Not on file   Years of education: Not on file   Highest education level: Not on file  Occupational History   Not on file  Tobacco Use   Smoking status: Former    Current packs/day: 0.00    Types: Cigarettes    Quit  date: 10/21/2007    Years since quitting: 15.1   Smokeless tobacco: Never  Substance and Sexual Activity   Alcohol use: No    Alcohol/week: 0.0 standard drinks of alcohol   Drug use: No   Sexual activity: Yes    Partners: Male  Other Topics Concern   Not on file  Social History Narrative      Social History:      Casimiro Needle - husband - would speak for her         Conley Rolls ann 86, [name] 79 daughters;       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; Grandson 2002    Social Determinants of Health   Financial Resource Strain: Low Risk  (12/20/2022)   Overall Financial Resource Strain (CARDIA)    Difficulty of Paying Living Expenses: Not hard at all  Food Insecurity: No Food Insecurity  (12/20/2022)   Hunger Vital Sign    Worried About Running Out of Food in the Last Year: Never true    Ran Out of Food in the Last Year: Never true  Transportation Needs: No Transportation Needs (12/20/2022)   PRAPARE - Administrator, Civil Service (Medical): No    Lack of Transportation (Non-Medical): No  Physical Activity: Insufficiently Active (12/20/2022)   Exercise Vital Sign    Days of Exercise per Week: 3 days    Minutes of Exercise per Session: 30 min  Stress: No Stress Concern Present (12/20/2022)   [employer] of Occupational Health - Occupational Stress Questionnaire    Feeling of Stress : Not at all  Social Connections: Moderately Integrated (12/20/2022)   Social Connection and Isolation Panel [NHANES]    Frequency of Communication with Friends and Family: More than three times a week    Frequency of Social Gatherings with Friends and Family: Three times a week    Attends Religious Services: More than 4 times per year    Active Member of Clubs or Organizations: No    Attends Banker Meetings: Never    Marital Status: Married    Tobacco Counseling Counseling given: Not Answered   Clinical Intake:  Pre-visit preparation completed: Yes  Pain : No/denies pain     Diabetes: No  How often do you need to have someone help you when you read instructions, pamphlets, or other written materials from your doctor or pharmacy?: 1 - Never  Interpreter Needed?: No  Information entered by :: Kandis Fantasia LPN   Activities of Daily Living    12/20/2022    6:06 PM  In your present state of health, do you have any difficulty performing the following activities:  Hearing? 0  Vision? 0  Difficulty concentrating or making decisions? 0  Walking or climbing stairs? 0  Dressing or bathing? 0  Doing errands, shopping? 0  Preparing Food and eating ? N  Using the Toilet? N  In the past six months, have you accidently leaked urine? N  Do you have  problems with loss of bowel control? N  Managing your Medications? N  Managing your Finances? N  Housekeeping or managing your Housekeeping? N    Patient Care Team: Carney Living, MD as PCP - General  Indicate any recent Medical Services you may have received from other than [facility] providers in the past year (date may be approximate).     Assessment:   This is a routine wellness examination for Gayanne.  Hearing/Vision screen Hearing Screening - Comments:: Denies hearing difficulties   Vision Screening -  Comments:: Wears rx glasses - up to date with routine eye exams with [facility]   Dietary issues and exercise activities discussed:     Goals Addressed             This Visit's Progress    Remain active and independent         Depression Screen    12/20/2022    6:04 PM 02/10/2022    2:15 PM 11/24/2021    9:41 AM 02/25/2021    8:40 AM 06/18/2020    9:31 AM 02/16/2017    9:46 AM 12/22/2016    9:25 AM  PHQ 2/9 Scores  PHQ - 2 Score 0 0 0 1 3 0 0  PHQ- 9 Score  3 4 3 7  0 0    Fall Risk    12/20/2022    6:05 PM 06/18/2020    9:31 AM 12/11/2019   11:09 AM 02/16/2017    9:46 AM 12/22/2016    9:25 AM  Fall Risk   Falls in the past year? 0 0 0 No No  Number falls in past yr: 0 0 0    Injury with Fall? 0 0 0    Risk for fall due to : No Fall Risks      Follow up Falls prevention discussed;Education provided;Falls evaluation completed        MEDICARE RISK AT HOME: Medicare Risk at Home Any stairs in or around the home?: No If so, are there any without handrails?: No Home free of loose throw rugs in walkways, pet beds, electrical cords, etc?: Yes Adequate lighting in your home to reduce risk of falls?: Yes Life alert?: No Use of a cane, walker or w/c?: No Grab bars in the bathroom?: Yes Shower chair or bench in shower?: Yes Elevated toilet seat or a handicapped toilet?: No  TIMED UP AND GO:  Was the test performed? No    Cognitive Function:         12/20/2022    6:06 PM  6CIT Screen  What Year? 0 points  What month? 0 points  What time? 0 points  Count back from 20 0 points  Months in reverse 0 points  Repeat phrase 0 points  Total Score 0 points    Immunizations Immunization History  Administered Date(s) Administered   Fluad Quad(high Dose 65+) 02/10/2022   Influenza Split 07/06/2011, 02/14/2012   Influenza Whole 02/25/2010   Influenza, Quadrivalent, Recombinant, Inj, Pf 03/30/2019   Influenza,inj,Quad PF,6+ Mos 02/26/2015, 02/03/2016, 02/16/2017, 02/25/2021   PFIZER(Purple Top)SARS-COV-2 Vaccination 09/19/2019, 10/10/2019   Tdap 07/06/2011    TDAP status: Due, Education has been provided regarding the importance of this vaccine. Advised may receive this vaccine at local pharmacy or Health Dept. Aware to provide a copy of the vaccination record if obtained from local pharmacy or Health Dept. Verbalized acceptance and understanding.  Flu Vaccine status: Due, Education has been provided regarding the importance of this vaccine. Advised may receive this vaccine at local pharmacy or Health Dept. Aware to provide a copy of the vaccination record if obtained from local pharmacy or Health Dept. Verbalized acceptance and understanding.  Pneumococcal vaccine status: Due, Education has been provided regarding the importance of this vaccine. Advised may receive this vaccine at local pharmacy or Health Dept. Aware to provide a copy of the vaccination record if obtained from local pharmacy or Health Dept. Verbalized acceptance and understanding.  Covid-19 vaccine status: Information provided on how to obtain vaccines.   Qualifies  for Shingles Vaccine? Yes   Zostavax completed No   Shingrix Completed?: No.    Education has been provided regarding the importance of this vaccine. Patient has been advised to call insurance company to determine out of pocket expense if they have not yet received this vaccine. Advised may also receive  vaccine at local pharmacy or Health Dept. Verbalized acceptance and understanding.  Screening Tests Health Maintenance  Topic Date Due   Zoster Vaccines- Shingrix (1 of 2) Never done   COVID-19 Vaccine (3 - Pfizer risk series) 11/07/2019   DTaP/Tdap/Td (2 - Td or Tdap) 07/05/2021   Pneumonia Vaccine 78+ Years old (1 of 1 - PCV) Never done   INFLUENZA VACCINE  12/02/2022   MAMMOGRAM  02/26/2023   Medicare Annual Wellness (AWV)  12/20/2023   Colonoscopy  07/15/2024   DEXA SCAN  Completed   Hepatitis C Screening  Completed   HPV VACCINES  Aged Out    Health Maintenance  Health Maintenance Due  Topic Date Due   Zoster Vaccines- Shingrix (1 of 2) Never done   COVID-19 Vaccine (3 - Pfizer risk series) 11/07/2019   DTaP/Tdap/Td (2 - Td or Tdap) 07/05/2021   Pneumonia Vaccine 30+ Years old (1 of 1 - PCV) Never done   INFLUENZA VACCINE  12/02/2022    Colorectal cancer screening: Type of screening: Colonoscopy. Completed 07/16/14. Repeat every 10 years  Mammogram status: Completed and records requested . Repeat every year  Bone Density status: Completed 03/18/22. Results reflect: Bone density results: OSTEOPENIA. Repeat every 2 years.  Lung Cancer Screening: (Low Dose CT Chest recommended if Age 1-80 years, 20 pack-year currently smoking OR have quit w/in 15years.) does not qualify.   Lung Cancer Screening Referral: n/a  Additional Screening:  Hepatitis C Screening: does qualify; Completed 02/26/15  Vision Screening: Recommended annual ophthalmology exams for early detection of glaucoma and other disorders of the eye. Is the patient up to date with their annual eye exam?  Yes  Who is the provider or what is the name of the office in which the patient attends annual eye exams? [facility]  If pt is not established with a provider, would they like to be referred to a provider to establish care? No .   Dental Screening: Recommended annual dental exams for proper oral  hygiene  Community Resource Referral / Chronic Care Management: CRR required this visit?  No   CCM required this visit?  No     Plan:     I have personally reviewed and noted the following in the patient's chart:   Medical and social history Use of alcohol, tobacco or illicit drugs  Current medications and supplements including opioid prescriptions. Patient is not currently taking opioid prescriptions. Functional ability and status Nutritional status Physical activity Advanced directives List of other physicians Hospitalizations, surgeries, and ER visits in previous 12 months Vitals Screenings to include cognitive, depression, and falls Referrals and appointments  In addition, I have reviewed and discussed with patient certain preventive protocols, quality metrics, and best practice recommendations. A written personalized care plan for preventive services as well as general preventive health recommendations were provided to patient.     Kandis Fantasia [place], [place]   7/82/9562   After Visit Summary: (Mail) Due to this being a telephonic visit, the after visit summary with patients personalized plan was offered to patient via mail   Nurse Notes: Patient complains of continued problems with GERD.

## 2022-12-20 NOTE — Patient Instructions (Addendum)
Kristi Evans , Thank you for taking time to come for your Medicare Wellness Visit. I appreciate your ongoing commitment to your health goals. Please review the following plan we discussed and let me know if I can assist you in the future.   Referrals/Orders/Follow-Ups/Clinician Recommendations: Aim for 30 minutes of exercise or brisk walking, 6-8 glasses of water, and 5 servings of fruits and vegetables each day.  This is a list of the screening recommended for you and due dates:  Health Maintenance  Topic Date Due   Zoster (Shingles) Vaccine (1 of 2) Never done   COVID-19 Vaccine (3 - Pfizer risk series) 11/07/2019   DTaP/Tdap/Td vaccine (2 - Td or Tdap) 07/05/2021   Pneumonia Vaccine (1 of 1 - PCV) Never done   Flu Shot  12/02/2022   Mammogram  02/26/2023   Medicare Annual Wellness Visit  12/20/2023   Colon Cancer Screening  07/15/2024   DEXA scan (bone density measurement)  Completed   Hepatitis C Screening  Completed   HPV Vaccine  Aged Out    Advanced directives: (ACP Link)Information on Advanced Care Planning can be found at [facility] of [place] Advance Health Care Directives Advance Health Care Directives ([email or web address]   Next Medicare Annual Wellness Visit scheduled for next year: Yes  Preventive Care 65 Years and Older, Female Preventive care refers to lifestyle choices and visits with your health care provider that can promote health and wellness. What does preventive care include? A yearly physical exam. This is also called an annual well check. Dental exams once or twice a year. Routine eye exams. Ask your health care provider how often you should have your eyes checked. Personal lifestyle choices, including: Daily care of your teeth and gums. Regular physical activity. Eating a healthy diet. Avoiding tobacco and drug use. Limiting alcohol use. Practicing safe sex. Taking low-dose aspirin every day. Taking vitamin and mineral supplements as recommended by  your health care provider. What happens during an annual well check? The services and screenings done by your health care provider during your annual well check will depend on your age, overall health, lifestyle risk factors, and family history of disease. Counseling  Your health care provider may ask you questions about your: Alcohol use. Tobacco use. Drug use. Emotional well-being. Home and relationship well-being. Sexual activity. Eating habits. History of falls. Memory and ability to understand (cognition). Work and work Astronomer. Reproductive health. Screening  You may have the following tests or measurements: Height, weight, and BMI. Blood pressure. Lipid and cholesterol levels. These may be checked every 5 years, or more frequently if you are over 60 years old. Skin check. Lung cancer screening. You may have this screening every year starting at age 2 if you have a 30-pack-year history of smoking and currently smoke or have quit within the past 15 years. Fecal occult blood test (FOBT) of the stool. You may have this test every year starting at age 41. Flexible sigmoidoscopy or colonoscopy. You may have a sigmoidoscopy every 5 years or a colonoscopy every 10 years starting at age 59. Hepatitis C blood test. Hepatitis B blood test. Sexually transmitted disease (STD) testing. Diabetes screening. This is done by checking your blood sugar (glucose) after you have not eaten for a while (fasting). You may have this done every 1-3 years. Bone density scan. This is done to screen for osteoporosis. You may have this done starting at age 77. Mammogram. This may be done every 1-2 years. Talk to your health  care provider about how often you should have regular mammograms. Talk with your health care provider about your test results, treatment options, and if necessary, the need for more tests. Vaccines  Your health care provider may recommend certain vaccines, such as: Influenza  vaccine. This is recommended every year. Tetanus, diphtheria, and acellular pertussis (Tdap, Td) vaccine. You may need a Td booster every 10 years. Zoster vaccine. You may need this after age 68. Pneumococcal 13-valent conjugate (PCV13) vaccine. One dose is recommended after age 75. Pneumococcal polysaccharide (PPSV23) vaccine. One dose is recommended after age 73. Talk to your health care provider about which screenings and vaccines you need and how often you need them. This information is not intended to replace advice given to you by your health care provider. Make sure you discuss any questions you have with your health care provider. Document Released: 05/16/2015 Document Revised: 01/07/2016 Document Reviewed: 02/18/2015 Elsevier Interactive Patient Education  2017 [employer].  Fall Prevention in the Home Falls can cause injuries. They can happen to people of all ages. There are many things you can do to make your home safe and to help prevent falls. What can I do on the outside of my home? Regularly fix the edges of walkways and driveways and fix any cracks. Remove anything that might make you trip as you walk through a door, such as a raised step or threshold. Trim any bushes or trees on the path to your home. Use bright outdoor lighting. Clear any walking paths of anything that might make someone trip, such as rocks or tools. Regularly check to see if handrails are loose or broken. Make sure that both sides of any steps have handrails. Any raised decks and porches should have guardrails on the edges. Have any leaves, snow, or ice cleared regularly. Use sand or salt on walking paths during winter. Clean up any spills in your garage right away. This includes oil or grease spills. What can I do in the bathroom? Use night lights. Install grab bars by the toilet and in the tub and shower. Do not use towel bars as grab bars. Use non-skid mats or decals in the tub or shower. If you  need to sit down in the shower, use a plastic, non-slip stool. Keep the floor dry. Clean up any water that spills on the floor as soon as it happens. Remove soap buildup in the tub or shower regularly. Attach bath mats securely with double-sided non-slip rug tape. Do not have throw rugs and other things on the floor that can make you trip. What can I do in the bedroom? Use night lights. Make sure that you have a light by your bed that is easy to reach. Do not use any sheets or blankets that are too big for your bed. They should not hang down onto the floor. Have a firm chair that has side arms. You can use this for support while you get dressed. Do not have throw rugs and other things on the floor that can make you trip. What can I do in the kitchen? Clean up any spills right away. Avoid walking on wet floors. Keep items that you use a lot in easy-to-reach places. If you need to reach something above you, use a strong step stool that has a grab bar. Keep electrical cords out of the way. Do not use floor polish or wax that makes floors slippery. If you must use wax, use non-skid floor wax. Do not have throw  rugs and other things on the floor that can make you trip. What can I do with my stairs? Do not leave any items on the stairs. Make sure that there are handrails on both sides of the stairs and use them. Fix handrails that are broken or loose. Make sure that handrails are as long as the stairways. Check any carpeting to make sure that it is firmly attached to the stairs. Fix any carpet that is loose or worn. Avoid having throw rugs at the top or bottom of the stairs. If you do have throw rugs, attach them to the floor with carpet tape. Make sure that you have a light switch at the top of the stairs and the bottom of the stairs. If you do not have them, ask someone to add them for you. What else can I do to help prevent falls? Wear shoes that: Do not have high heels. Have rubber  bottoms. Are comfortable and fit you well. Are closed at the toe. Do not wear sandals. If you use a stepladder: Make sure that it is fully opened. Do not climb a closed stepladder. Make sure that both sides of the stepladder are locked into place. Ask someone to hold it for you, if possible. Clearly mark and make sure that you can see: Any grab bars or handrails. First and last steps. Where the edge of each step is. Use tools that help you move around (mobility aids) if they are needed. These include: Canes. Walkers. Scooters. Crutches. Turn on the lights when you go into a dark area. Replace any light bulbs as soon as they burn out. Set up your furniture so you have a clear path. Avoid moving your furniture around. If any of your floors are uneven, fix them. If there are any pets around you, be aware of where they are. Review your medicines with your doctor. Some medicines can make you feel dizzy. This can increase your chance of falling. Ask your doctor what other things that you can do to help prevent falls. This information is not intended to replace advice given to you by your health care provider. Make sure you discuss any questions you have with your health care provider. Document Released: 02/13/2009 Document Revised: 09/25/2015 Document Reviewed: 05/24/2014 Elsevier Interactive Patient Education  2017 [employer].

## 2023-03-02 ENCOUNTER — Other Ambulatory Visit: Payer: Self-pay | Admitting: Family Medicine

## 2023-03-02 DIAGNOSIS — E7800 Pure hypercholesterolemia, unspecified: Secondary | ICD-10-CM

## 2023-03-03 ENCOUNTER — Other Ambulatory Visit: Payer: Self-pay

## 2023-03-03 DIAGNOSIS — G43909 Migraine, unspecified, not intractable, without status migrainosus: Secondary | ICD-10-CM

## 2023-03-03 MED ORDER — AMITRIPTYLINE HCL 100 MG PO TABS: ORAL_TABLET | ORAL | 0 refills | Status: DC

## 2023-03-11 ENCOUNTER — Emergency Department (HOSPITAL_COMMUNITY)
Admission: EM | Admit: 2023-03-11 | Discharge: 2023-03-12 | Disposition: A | Discharge status: Home / Self Care | Payer: Medicare HMO | Attending: Emergency Medicine | Admitting: Emergency Medicine

## 2023-03-11 ENCOUNTER — Encounter: Payer: Self-pay | Admitting: Student

## 2023-03-11 ENCOUNTER — Emergency Department (HOSPITAL_COMMUNITY): Payer: Medicare HMO

## 2023-03-11 ENCOUNTER — Ambulatory Visit (INDEPENDENT_AMBULATORY_CARE_PROVIDER_SITE_OTHER): Payer: No Typology Code available for payment source | Admitting: Student

## 2023-03-11 ENCOUNTER — Other Ambulatory Visit (HOSPITAL_COMMUNITY): Payer: Self-pay | Admitting: Radiology

## 2023-03-11 ENCOUNTER — Other Ambulatory Visit: Payer: Self-pay

## 2023-03-11 ENCOUNTER — Encounter (HOSPITAL_COMMUNITY): Payer: Self-pay | Admitting: *Deleted

## 2023-03-11 VITALS — BP 131/89 | HR 92 | Ht 62.0 in | Wt 170.8 lb

## 2023-03-11 DIAGNOSIS — R29898 Other symptoms and signs involving the musculoskeletal system: Secondary | ICD-10-CM

## 2023-03-11 DIAGNOSIS — M5023 Other cervical disc displacement, cervicothoracic region: Secondary | ICD-10-CM | POA: Diagnosis not present

## 2023-03-11 DIAGNOSIS — M5021 Other cervical disc displacement,  high cervical region: Secondary | ICD-10-CM | POA: Diagnosis not present

## 2023-03-11 DIAGNOSIS — M542 Cervicalgia: Secondary | ICD-10-CM | POA: Diagnosis not present

## 2023-03-11 DIAGNOSIS — M5412 Radiculopathy, cervical region: Secondary | ICD-10-CM

## 2023-03-11 DIAGNOSIS — R200 Anesthesia of skin: Secondary | ICD-10-CM | POA: Insufficient documentation

## 2023-03-11 DIAGNOSIS — R9082 White matter disease, unspecified: Secondary | ICD-10-CM | POA: Diagnosis not present

## 2023-03-11 DIAGNOSIS — R42 Dizziness and giddiness: Secondary | ICD-10-CM | POA: Diagnosis not present

## 2023-03-11 DIAGNOSIS — R531 Weakness: Secondary | ICD-10-CM | POA: Diagnosis not present

## 2023-03-11 DIAGNOSIS — M4802 Spinal stenosis, cervical region: Secondary | ICD-10-CM | POA: Diagnosis not present

## 2023-03-11 DIAGNOSIS — R519 Headache, unspecified: Secondary | ICD-10-CM | POA: Diagnosis not present

## 2023-03-11 DIAGNOSIS — M47812 Spondylosis without myelopathy or radiculopathy, cervical region: Secondary | ICD-10-CM | POA: Diagnosis not present

## 2023-03-11 DIAGNOSIS — I639 Cerebral infarction, unspecified: Secondary | ICD-10-CM | POA: Diagnosis present

## 2023-03-11 LAB — ETHANOL: Alcohol, Ethyl (B): <10 mg/dL (ref <10)

## 2023-03-11 LAB — DIFFERENTIAL
Abs Immature Granulocytes: 0.02 10*3/uL (ref 0.00–0.07)
Basophils Absolute: 0.1 10*3/uL (ref 0.0–0.1)
Basophils Relative: 1 %
Eosinophils Absolute: 0.2 10*3/uL (ref 0.0–0.5)
Eosinophils Relative: 2 %
Immature Granulocytes: 0 %
Lymphocytes Relative: 38 %
Lymphs Abs: 2.7 10*3/uL (ref 0.7–4.0)
Monocytes Absolute: 0.5 10*3/uL (ref 0.1–1.0)
Monocytes Relative: 7 %
Neutro Abs: 3.7 10*3/uL (ref 1.7–7.7)
Neutrophils Relative %: 52 %

## 2023-03-11 LAB — CBC
HCT: 40.5 % (ref 36.0–46.0)
Hemoglobin: 13.2 g/dL (ref 12.0–15.0)
MCH: 29.9 pg (ref 26.0–34.0)
MCHC: 32.6 g/dL (ref 30.0–36.0)
MCV: 91.6 fL (ref 80.0–100.0)
Platelets: 257 10*3/uL (ref 150–400)
RBC: 4.42 MIL/uL (ref 3.87–5.11)
RDW: 12.7 % (ref 11.5–15.5)
WBC: 7.2 10*3/uL (ref 4.0–10.5)
nRBC: 0 % (ref 0.0–0.2)

## 2023-03-11 LAB — I-STAT CHEM 8, ED
BUN: 12 mg/dL (ref 8–23)
Calcium, Ion: 1.19 mmol/L (ref 1.15–1.40)
Chloride: 103 mmol/L (ref 98–111)
Creatinine, Ser: 1 mg/dL (ref 0.44–1.00)
Glucose, Bld: 105 mg/dL — ABNORMAL HIGH (ref 70–99)
HCT: 39 % (ref 36.0–46.0)
Hemoglobin: 13.3 g/dL (ref 12.0–15.0)
Potassium: 4 mmol/L (ref 3.5–5.1)
Sodium: 139 mmol/L (ref 135–145)
TCO2: 27 mmol/L (ref 22–32)

## 2023-03-11 LAB — COMPREHENSIVE METABOLIC PANEL
ALT: 35 U/L (ref 0–44)
AST: 30 U/L (ref 15–41)
Albumin: 4.1 g/dL (ref 3.5–5.0)
Alkaline Phosphatase: 91 U/L (ref 38–126)
Anion gap: 12 (ref 5–15)
BUN: 10 mg/dL (ref 8–23)
CO2: 24 mmol/L (ref 22–32)
Calcium: 9.7 mg/dL (ref 8.9–10.3)
Chloride: 103 mmol/L (ref 98–111)
Creatinine, Ser: 1 mg/dL (ref 0.44–1.00)
GFR, Estimated: >60 mL/min (ref >60)
Glucose, Bld: 106 mg/dL — ABNORMAL HIGH (ref 70–99)
Potassium: 4.2 mmol/L (ref 3.5–5.1)
Sodium: 139 mmol/L (ref 135–145)
Total Bilirubin: 0.7 mg/dL (ref <1.2)
Total Protein: 7 g/dL (ref 6.5–8.1)

## 2023-03-11 LAB — CBG MONITORING, ED: Glucose-Capillary: 101 mg/dL — ABNORMAL HIGH (ref 70–99)

## 2023-03-11 LAB — PROTIME-INR
INR: 0.9 (ref 0.8–1.2)
Prothrombin Time: 12.6 s (ref 11.4–15.2)

## 2023-03-11 LAB — APTT: aPTT: 27 s (ref 24–36)

## 2023-03-11 MED ORDER — LORAZEPAM 1 MG PO TABS
1.0000 mg | ORAL_TABLET | Freq: Once | ORAL | Status: AC
  Administered 2023-03-11: 1 mg via ORAL
  Filled 2023-03-11: qty 1

## 2023-03-11 MED ORDER — SODIUM CHLORIDE 0.9% FLUSH: 3.0000 mL | Freq: Once | INTRAVENOUS | Status: DC

## 2023-03-11 NOTE — ED Notes (Signed)
A and o x4 

## 2023-03-11 NOTE — Assessment & Plan Note (Addendum)
Patient appreciates left-sided hand/arm numbness that began 2 weeks ago.  Patient denies any history of trauma or injury.  Patient appreciates difficulty with using her left hand, dropping things, unable to use tools she had used in the past.  Patient otherwise in normal state of health, but does appreciate some dizziness every now and again lasting a few seconds.  Patient's neuroexam was concerning for left arm weakness, and decreased sensation in left arm.  Her findings and history are concerning for CVA.  Cannot rule out radiculopathy, low concern for infection causing this issue, also considered cervical lesion/mass.  Will recommend patient go to emergency department for evaluation, and for increased risk of stroke, with history of hyperlipidemia.  Patient was walked to the emergency department via staff. - Go to the emergency department

## 2023-03-11 NOTE — ED Triage Notes (Signed)
The pt thinks that she may be having a stroke for one week she has been dizzy and she has had a numbness in her lr arm and shoulder  also c/o a headache  hx of migraine headaches
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
## 2023-03-11 NOTE — ED Notes (Signed)
A pa has said that she has seen this pt however the mse is not checked off

## 2023-03-11 NOTE — ED Notes (Signed)
I have made two calls to get a provider to see this pt  unable to locate one

## 2023-03-11 NOTE — Patient Instructions (Signed)
It was great to see you! Thank you for allowing me to participate in your care!  I recommend that you always bring your medications to each appointment as this makes it easy to ensure we are on the correct medications and helps [place] not miss when refills are needed.  Our plans for today:  - Arm Weakness I'm not sure what's causing your Arm weakness, but I'm concerned it could be coming from a stroke. You need a big work up/evaluation and are at risk for more events.    I recommend you go to the Emergency Department   Take care and seek immediate care sooner if you develop any concerns.   Dr. Bess Kinds, MD [facility]

## 2023-03-11 NOTE — ED Provider Triage Note (Signed)
Emergency Medicine Provider Triage Evaluation Note  PRICSILLA RUDE , a 67 y.o. female  was evaluated in triage.  Pt complains of weakness in her left arm for the last week.  Also reports some intermittent feelings of weakness in her left lower extremity, but this is not as frequent as in her left arm.  Reports numbness and tingling in the left hand.  Denies any chest pain, shortness of breath, facial droop, slurred speech, dizziness, any other symptoms.  Review of Systems  Positive: As above Negative: As above  Physical Exam  BP (!) 148/84 (BP Location: Right Arm)   Pulse 80   Temp 97.8 F (36.6 C) (Oral)   Resp 18   Ht 5\' 2"  (1.575 m)   Wt 77.5 kg   SpO2 100%   BMI 31.25 kg/m  Gen:   Awake, no distress   Resp:  Normal effort  MSK:   Moves extremities without difficulty  Other:  4/5 strength of the left upper extremity compared to 5/5 strength of the right upper extremity  No slurred speech or facial droop Able to ambulate without difficulty  Medical Decision Making  Medically screening exam initiated at 3:33 PM.  Appropriate orders placed.  MALEIKA RENTON was informed that the remainder of the evaluation will be completed by another provider, this initial triage assessment does not replace that evaluation, and the importance of remaining in the ED until their evaluation is complete.     Arabella Merles, PA-C 03/11/23 1534

## 2023-03-11 NOTE — ED Provider Notes (Signed)
[place] EMERGENCY DEPARTMENT AT [facility] Provider Note   CSN: [id number] Arrival date & time: 03/11/23  1452     History  Chief Complaint  Patient presents with   Shoulder Pain    Kristi Evans is a 67 y.o. female.  Patient is a 67 year old female with past medical history of hyperlipidemia and migraines presenting to the emergency department with left arm numbness and weakness.  The patient states over the last week she has had increasing numbness and weakness in her left arm.  She states that she was unable to clip her nails today because her hand felt too weak.  She states that she has had mild pain in the left side of her neck.  She reports that her left leg has also felt heavy today.  She denies any numbness in the leg or any numbness or weakness to her face.  She states that she was seen by her primary doctor and was recommended to come to the ER to evaluate for possible stroke.  The patient reports that she has also had headaches recently.  She does report a history of migraine headaches.  Dates that this feels similar to her prior headaches.  The patient denies any recent trauma or falls.  The history is provided by the patient.  Shoulder Pain      Home Medications Prior to Admission medications   Medication Sig Start Date End Date Taking? Authorizing Provider  amitriptyline (ELAVIL) 100 MG tablet TAKE 1 TABLET BY MOUTH NIGHTLY 03/03/23   Carney Living, MD  Multiple Vitamin (MULTIVITAMIN WITH MINERALS) TABS tablet Take 1 tablet by mouth daily.    [provider]  omeprazole (PRILOSEC) 20 MG capsule TAKE 1 CAPSULE TWICE DAILY 06/07/14   Carney Living, MD  simvastatin (ZOCOR) 40 MG tablet Take 1 tablet by mouth once daily 03/02/23   Carney Living, MD  SUMAtriptan (IMITREX) 100 MG tablet Take 1 tablet (100 mg total) by mouth every 2 (two) hours as needed for migraine. May repeat in 2 hours if headache persists or recurs. 02/10/22    Carney Living, MD  traMADol (ULTRAM) 50 MG tablet TAKE 1 TABLET BY MOUTH EVERY 6 HOURS AS NEEDED 08/09/22   Carney Living, MD      Allergies    Aspirin and Codeine phosphate    Review of Systems   Review of Systems  Physical Exam Updated Vital Signs BP 127/77 (BP Location: Right Arm)   Pulse 82   Temp 97.7 F (36.5 C) (Oral)   Resp 16   Ht 5\' 2"  (1.575 m)   Wt 77.5 kg   SpO2 100%   BMI 31.25 kg/m  Physical Exam Vitals and nursing note reviewed.  Constitutional:      General: She is not in acute distress.    Appearance: Normal appearance.  HENT:     Head: Normocephalic and atraumatic.     Nose: Nose normal.     Mouth/Throat:     Mouth: Mucous membranes are moist.     Pharynx: Oropharynx is clear.  Eyes:     Extraocular Movements: Extraocular movements intact.     Conjunctiva/sclera: Conjunctivae normal.     Pupils: Pupils are equal, round, and reactive to light.  Neck:     Comments: Positive Spurling's test on the left Cardiovascular:     Rate and Rhythm: Normal rate and regular rhythm.     Heart sounds: Normal heart sounds.  Pulmonary:  Effort: Pulmonary effort is normal.     Breath sounds: Normal breath sounds.  Abdominal:     General: Abdomen is flat.     Palpations: Abdomen is soft.     Tenderness: There is no abdominal tenderness.  Musculoskeletal:        General: Normal range of motion.     Cervical back: Normal range of motion and neck supple. Tenderness (Mild left-sided cervical paraspinal muscle tenderness) present.     Right lower leg: No edema.     Left lower leg: No edema.  Skin:    General: Skin is warm and dry.  Neurological:     Mental Status: She is alert and oriented to person, place, and time.     Comments: Decreased sensation diffusely in left arm and left leg, 4 out of 5 strength in left grip strength elbow flexion/extension and shoulder abduction, no drift in bilateral upper or lower extremities though subjective  heaviness in left leg No obvious facial droop, sensation intact in face, normal speech, normal finger-to-nose bilaterally  Psychiatric:        Mood and Affect: Mood normal.        Behavior: Behavior normal.     ED Results / Procedures / Treatments   Labs (all labs ordered are listed, but only abnormal results are displayed) Labs Reviewed  COMPREHENSIVE METABOLIC PANEL - Abnormal; Notable for the following components:      Result Value   Glucose, Bld 106 (*)    All other components within normal limits  CBG MONITORING, ED - Abnormal; Notable for the following components:   Glucose-Capillary 101 (*)    All other components within normal limits  I-STAT CHEM 8, ED - Abnormal; Notable for the following components:   Glucose, Bld 105 (*)    All other components within normal limits  PROTIME-INR  APTT  CBC  DIFFERENTIAL  ETHANOL  COMPREHENSIVE METABOLIC PANEL  CBG MONITORING, ED  TROPONIN I (HIGH SENSITIVITY)    EKG EKG Interpretation Date/Time:  Friday March 11 2023 20:40:09 EST Ventricular Rate:  71 PR Interval:  171 QRS Duration:  86 QT Interval:  437 QTC Calculation: 475 R Axis:   12  Text Interpretation: Sinus rhythm Low voltage, precordial leads Abnormal R-wave progression, early transition Borderline T abnormalities, anterior leads No significant change since last tracing Confirmed by Elayne Snare ([id number]) on 03/11/2023 9:07:37 PM  Radiology CT HEAD WO CONTRAST  Result Date: 03/11/2023 CLINICAL DATA:  Dizziness, numbness in arm and shoulder, headache EXAM: CT HEAD WITHOUT CONTRAST TECHNIQUE: Contiguous axial images were obtained from the base of the skull through the vertex without intravenous contrast. RADIATION DOSE REDUCTION: This exam was performed according to the departmental dose-optimization program which includes automated exposure control, adjustment of the mA and/or kV according to patient size and/or use of iterative reconstruction technique.  COMPARISON:  None Available. FINDINGS: Brain: No evidence of acute infarction, hemorrhage, mass, mass effect, or midline shift. No hydrocephalus or extra-axial fluid collection. Vascular: No hyperdense vessel. Skull: Negative for fracture or focal lesion. Sinuses/Orbits: No acute finding. Other: The mastoid air cells are well aerated. IMPRESSION: No acute intracranial process. Electronically Signed   By: Wiliam Ke M.D.   On: 03/11/2023 19:07    Procedures Procedures    Medications Ordered in ED Medications  sodium chloride flush (NS) 0.9 % injection 3 mL (3 mLs Intravenous Not Given 03/11/23 2002)  LORazepam (ATIVAN) tablet 1 mg (1 mg Oral Given 03/11/23 2103)    ED  Course/ Medical Decision Making/ A&P Clinical Course as of 03/11/23 2327  Fri Mar 11, 2023  2325 Patient signed out to Dr. Manus Gunning pending MRI read, plan for discharge with spine follow up if MRI negative.  [VK]    Clinical Course User Index [VK] Rexford Maus, DO                                 Medical Decision Making This patient presents to the ED with chief complaint(s) of L-sided weakness/numbness with pertinent past medical history of HLD which further complicates the presenting complaint. The complaint involves an extensive differential diagnosis and also carries with it a high risk of complications and morbidity.    The differential diagnosis includes CVA, TIA, ICH, mass effect, radiculopathy, atypical ACS, hypo or hyperglycemic crisis, electrolyte abnormality  Additional history obtained: Additional history obtained from N/A Records reviewed Primary Care Documents  ED Course and Reassessment: On patient's arrival she is hemodynamically stable in no acute distress.  Was initially evaluated by provider in triage and had EKG, labs and head CT ordered.  Due to patient's symptoms ongoing for the last week she is outside of the tPA window and is undergone a subacute stroke workup.  The patient's labs are within  normal range, CT head without acute abnormality.  She does have a positive Spurling's test on exam seems likely radiculopathy however with symptoms affecting her left leg as well will have MRI of her head and neck performed.  Patient will be closely reassessed.  Independent labs interpretation:  The following labs were independently interpreted: within normal range  Independent visualization of imaging: - I independently visualized the following imaging with scope of interpretation limited to determining acute life threatening conditions related to emergency care: CTH, which revealed no acute disease     Amount and/or Complexity of Data Reviewed Radiology: ordered.  Risk Prescription drug management.          Final Clinical Impression(s) / ED Diagnoses Final diagnoses:  None    Rx / DC Orders ED Discharge Orders     None         Rexford Maus, DO 03/11/23 2327

## 2023-03-11 NOTE — ED Notes (Signed)
MRI tech notified that patient is ready for MRI .

## 2023-03-12 LAB — COMPREHENSIVE METABOLIC PANEL
ALT: 34 U/L (ref 0–44)
AST: 35 U/L (ref 15–41)
Albumin: 3.7 g/dL (ref 3.5–5.0)
Alkaline Phosphatase: 82 U/L (ref 38–126)
Anion gap: 8 (ref 5–15)
BUN: 10 mg/dL (ref 8–23)
CO2: 26 mmol/L (ref 22–32)
Calcium: 9.4 mg/dL (ref 8.9–10.3)
Chloride: 105 mmol/L (ref 98–111)
Creatinine, Ser: 0.94 mg/dL (ref 0.44–1.00)
GFR, Estimated: >60 mL/min (ref >60)
Glucose, Bld: 115 mg/dL — ABNORMAL HIGH (ref 70–99)
Potassium: 4 mmol/L (ref 3.5–5.1)
Sodium: 139 mmol/L (ref 135–145)
Total Bilirubin: 1 mg/dL (ref <1.2)
Total Protein: 6.4 g/dL — ABNORMAL LOW (ref 6.5–8.1)

## 2023-03-12 LAB — TROPONIN I (HIGH SENSITIVITY): Troponin I (High Sensitivity): 2 ng/L (ref <18)

## 2023-03-12 MED ORDER — METOCLOPRAMIDE HCL 5 MG/ML IJ SOLN
10.0000 mg | Freq: Once | INTRAMUSCULAR | Status: AC
  Administered 2023-03-12: 10 mg via INTRAVENOUS
  Filled 2023-03-12: qty 2

## 2023-03-12 MED ORDER — DEXAMETHASONE SODIUM PHOSPHATE 10 MG/ML IJ SOLN
10.0000 mg | Freq: Once | INTRAMUSCULAR | Status: AC
  Administered 2023-03-12: 10 mg via INTRAVENOUS
  Filled 2023-03-12: qty 1

## 2023-03-12 MED ORDER — METHOCARBAMOL 500 MG PO TABS: 500.0000 mg | ORAL_TABLET | Freq: Three times a day (TID) | ORAL | 0 refills | Status: DC | PRN

## 2023-03-12 MED ORDER — DIPHENHYDRAMINE HCL 50 MG/ML IJ SOLN
25.0000 mg | Freq: Once | INTRAMUSCULAR | Status: AC
  Administered 2023-03-12: 25 mg via INTRAVENOUS
  Filled 2023-03-12: qty 1

## 2023-03-12 MED ORDER — METHYLPREDNISOLONE 4 MG PO TBPK: ORAL_TABLET | ORAL | 0 refills | Status: DC

## 2023-03-12 NOTE — ED Provider Notes (Signed)
Care assumed from Dr. Theresia Lo.  Patient here with a 1 week history of left arm weakness and numbness.  Also questionable heaviness to left leg.  Pending brain MRI and C-spine.    IMPRESSION: 1. No acute intracranial abnormality. 2. Mild cerebral white matter disease, nonspecific, but most commonly related to chronic microvascular ischemic disease.   IMPRESSION:  1. Normal MRI appearance of the cervical spinal cord. No evidence  for myelopathy.  2. Mild-to-moderate multilevel cervical spondylosis. Resultant mild  spinal stenosis at C3-4. Mild to moderate left C5 through C7  foraminal stenosis as above. No other significant stenosis elsewhere  within the cervical spine.  3. Small left paracentral to foraminal disc protrusion at C7-T1. The  left C8 nerve root could potentially be affected.   MRI negative for stroke as above.  Does show multilevel cervical spondylosis with spinal stenosis and possible foraminal disc protrusion at C7/T1.  Patient still with some grip strength weakness on the left and numbness to her left arm.  Will give course of steroids and follow-up with neurosurgery.   Glynn Octave, MD 03/12/23 0201

## 2023-03-12 NOTE — Discharge Instructions (Addendum)
Your testing is negative for stroke.  Does show some arthritis in your neck with an area of spinal stenosis and possible bulging disc which may be causing her arm weakness.  Take the steroids as prescribed and follow-up with a neurosurgeon for further evaluation.  Return to the ED with new or worsening symptoms.

## 2023-03-22 DIAGNOSIS — Z6832 Body mass index (BMI) 32.0-32.9, adult: Secondary | ICD-10-CM | POA: Diagnosis not present

## 2023-03-22 DIAGNOSIS — M4722 Other spondylosis with radiculopathy, cervical region: Secondary | ICD-10-CM | POA: Diagnosis not present

## 2023-03-25 ENCOUNTER — Other Ambulatory Visit: Payer: Self-pay | Admitting: Neurosurgery

## 2023-03-28 ENCOUNTER — Other Ambulatory Visit (HOSPITAL_COMMUNITY): Payer: Self-pay | Admitting: Neurosurgery

## 2023-03-28 ENCOUNTER — Other Ambulatory Visit: Payer: Self-pay | Admitting: Neurosurgery

## 2023-03-28 DIAGNOSIS — M4722 Other spondylosis with radiculopathy, cervical region: Secondary | ICD-10-CM

## 2023-03-29 ENCOUNTER — Telehealth: Payer: Self-pay

## 2023-03-29 NOTE — Telephone Encounter (Signed)
Transition Care Management Follow-up Telephone Call Date of discharge and from where: 03/12/2023 [facility] How have you been since you were released from the hospital? Patient stated she is not feeling any better Any questions or concerns? No  Items Reviewed: Did the pt receive and understand the discharge instructions provided? Yes  Medications obtained and verified? Yes  Other? No  Any new allergies since your discharge? No  Dietary orders reviewed? Yes Do you have support at home? Yes   Follow up appointments reviewed:  PCP Hospital f/u appt confirmed? No  Scheduled to see  on @ . Specialist Hospital f/u appt confirmed?  Patient stated she has an appointment with her Neurologist on 04/04/2023  Scheduled to see  on  @ . Are transportation arrangements needed? No  If their condition worsens, is the pt aware to call PCP or go to the Emergency Dept.? Yes Was the patient provided with contact information for the PCP's office or ED? Yes Was to pt encouraged to call back with questions or concerns? Yes   Krysti Hickling [facility]  [facility], [facility] Guide Direct Dial: [phone]  Website: [email or web address]

## 2023-03-29 NOTE — Telephone Encounter (Signed)
Transition Care Management Unsuccessful Follow-up Telephone Call  Date of discharge and from where:  03/12/2023 [facility]  Attempts:  1st Attempt  Reason for unsuccessful TCM follow-up call:  No answer/busy unable to leave message.  Sydnie Sigmund [facility]  [facility], [facility] Guide Direct Dial: [phone]  Website: [email or web address]

## 2023-04-04 ENCOUNTER — Other Ambulatory Visit (HOSPITAL_COMMUNITY): Payer: Self-pay | Admitting: Neurosurgery

## 2023-04-04 ENCOUNTER — Ambulatory Visit (HOSPITAL_COMMUNITY)
Admission: RE | Admit: 2023-04-04 | Discharge: 2023-04-04 | Disposition: A | Discharge status: Home / Self Care | Payer: Medicare HMO | Source: Ambulatory Visit | Attending: Neurosurgery | Admitting: Neurosurgery

## 2023-04-04 ENCOUNTER — Telehealth: Payer: Self-pay

## 2023-04-04 DIAGNOSIS — M4722 Other spondylosis with radiculopathy, cervical region: Secondary | ICD-10-CM

## 2023-04-04 DIAGNOSIS — M4802 Spinal stenosis, cervical region: Secondary | ICD-10-CM | POA: Insufficient documentation

## 2023-04-04 DIAGNOSIS — M2578 Osteophyte, vertebrae: Secondary | ICD-10-CM | POA: Insufficient documentation

## 2023-04-04 DIAGNOSIS — M5031 Other cervical disc degeneration,  high cervical region: Secondary | ICD-10-CM | POA: Insufficient documentation

## 2023-04-04 DIAGNOSIS — M542 Cervicalgia: Secondary | ICD-10-CM | POA: Diagnosis not present

## 2023-04-04 MED ORDER — LIDOCAINE HCL (PF) 1 % IJ SOLN
5.0000 mL | Freq: Once | INTRAMUSCULAR | Status: AC
  Administered 2023-04-04: 5 mL

## 2023-04-04 MED ORDER — ONDANSETRON HCL 4 MG/2ML IJ SOLN: 4.0000 mg | Freq: Four times a day (QID) | INTRAMUSCULAR | Status: DC | PRN

## 2023-04-04 MED ORDER — OXYCODONE HCL 5 MG PO TABS
5.0000 mg | ORAL_TABLET | ORAL | Status: DC | PRN
Start: 2023-04-04 — End: 2023-04-05
  Administered 2023-04-04: 10 mg via ORAL
  Filled 2023-04-04: qty 2

## 2023-04-04 MED ORDER — IOHEXOL 300 MG/ML  SOLN
10.0000 mL | Freq: Once | INTRAMUSCULAR | Status: AC | PRN
  Administered 2023-04-04: 10 mL via INTRATHECAL

## 2023-04-04 MED ORDER — IOHEXOL 300 MG/ML  SOLN: 10.0000 mL | Freq: Once | INTRAMUSCULAR | Status: DC | PRN

## 2023-04-04 NOTE — Op Note (Signed)
04/04/2023 cervical Myelogram  PATIENT:  Kristi Evans is a 67 y.o. female With cervicalgia PRE-OPERATIVE DIAGNOSIS:  sAme  POST-OPERATIVE DIAGNOSIS:  cervicalgia  PROCEDURE:  Lumbar Myelogram  SURGEON:  Jearl Soto  ANESTHESIA:   local LOCAL MEDICATIONS USED:  LIDOCAINE  Procedure Note: Kristi Evans is a 67 y.o. female Was taken to the fluoroscopy suite and  positioned prone on the fluoroscopy table. Her back was prepared and draped in a sterile manner. I infiltrated 5 cc  lidocaine into the lumbar region. I then introduced a spinal needle into the thecal sac at the l3/4 interlaminar space. I infiltrated 10cc of Isovue 300 into the thecal sac. Fluoroscopy showed the needle and contrast in the thecal sac. Kristi Evans tolerated the procedure well. she Will be taken to CT for evaluation.     PATIENT DISPOSITION:  Short Stay

## 2023-04-04 NOTE — Telephone Encounter (Signed)
Patient LVM on nurse line requesting pain medication post procedure today.   Called patient back, she did not answer, LVM asking that she return call to office.   Veronda Prude, RN

## 2023-04-04 NOTE — Telephone Encounter (Signed)
Patient returns call to nurse line. She reports having a myelogram at the hospital today. She was given oxycodone while in the hospital but was not discharged with any pain medication.   She reports asking about pain medication at the hospital and she was told to follow up with PCP.   Advised that we would likely need to see her in the office prior to prescribing narcotics. She asked that I send message to Dr. Deirdre Priest. She thinks that she would "only need a few" to get her through.   She is currently reporting a headache, no visual changes.   Advised that I would recommend calling the neurologist today as well due to pain. She will call their office regarding pain.   ED precautions discussed.   Veronda Prude, RN

## 2023-04-05 NOTE — Telephone Encounter (Signed)
Spoke with patient and was able to schedule her for a ED follow-up 04/11/23 at 2:10 pm. Penni Bombard CMA

## 2023-04-05 NOTE — Telephone Encounter (Signed)
Patient needs an ED follow up visit to discuss- please schedule. Kristi Starr, MD  Family Medicine Teaching Service

## 2023-04-07 ENCOUNTER — Ambulatory Visit (INDEPENDENT_AMBULATORY_CARE_PROVIDER_SITE_OTHER): Payer: No Typology Code available for payment source | Admitting: Family Medicine

## 2023-04-07 ENCOUNTER — Encounter: Payer: Self-pay | Admitting: Family Medicine

## 2023-04-07 ENCOUNTER — Other Ambulatory Visit: Payer: Self-pay | Admitting: Family Medicine

## 2023-04-07 VITALS — BP 124/82 | HR 99 | Ht 61.0 in | Wt 171.6 lb

## 2023-04-07 DIAGNOSIS — G4459 Other complicated headache syndrome: Secondary | ICD-10-CM

## 2023-04-07 DIAGNOSIS — M5090 Cervical disc disorder, unspecified, unspecified cervical region: Secondary | ICD-10-CM | POA: Diagnosis not present

## 2023-04-07 MED ORDER — TRAMADOL HCL 50 MG PO TABS: 50.0000 mg | ORAL_TABLET | Freq: Four times a day (QID) | ORAL | 0 refills | Status: DC | PRN

## 2023-04-07 MED ORDER — METHOCARBAMOL 500 MG PO TABS: 500.0000 mg | ORAL_TABLET | Freq: Three times a day (TID) | ORAL | 0 refills | Status: DC | PRN

## 2023-04-07 NOTE — Progress Notes (Signed)
Saw with Dr Velna Ochs on 04/07/23

## 2023-04-07 NOTE — Progress Notes (Signed)
    SUBJECTIVE:   CHIEF COMPLAINT / HPI: headache  ED follow-up for weakness and numbness. Got MRI which showed no stroke, but significant degenerative disease of cervical spine. Showed foramenal stenosis and possible disc protusion, but no evidence of myelopathy. Got myelogram with Neurosurgery. Showed no central canal stenosis.   Has headache since procedure, as well as aching legs and back. All starting after procedure. Headache is bilateral frontal and occipital. Constant, not pulsing. No blurry vision. States Tylenol does not help. Has history of migraine and tension headache.  PERTINENT  PMH / PSH: Migraine, Depression  OBJECTIVE:   BP 124/82   Pulse 99   Ht 5\' 1"  (1.549 m)   Wt 171 lb 9.6 oz (77.8 kg)   SpO2 99%   BMI 32.42 kg/m   General: NAD, well appearing Neuro: A&O Respiratory: normal WOB on RA Extremities: Moving all 4 extremities equally Back: No tenderness or erythema along spinous processes, no palpable deformity, trapezius muscles tense, left side upper extremity strength 4 out of 5, right side upper extremity 5 out of 5, symmetric facies, pupils equal round reactive, extraocular motion intact  ASSESSMENT/PLAN:   Assessment & Plan Cervical back pain with evidence of disc disease Myelogram without evidence of myelopathy but with severe degenerative changes in including foramenal stenosis. Likely causing patient's symptoms. Discussed with patient that long term she will need PT and strengthening of those muscles, but may see benefit with cervical CSI injection. Referral to PMR placed. Patient agreeable to plan. Other complicated headache syndrome No red flags identified on exam or history today. History consistent with cervicogenic in the setting of lumbar procedure in addition to stress headache. Expect headache will slowly improve over the next 1-2 weeks. Instructed patient to call neurosurgeon who performed procedure if she still continues to have symptoms.  Counseled on Tylenol and home Tramadol use as needed.  Precepted with Dr. Deirdre Priest.  Return if symptoms worsen or fail to improve.  Celine Mans, MD [facility]

## 2023-04-07 NOTE — Patient Instructions (Signed)
It was great to see you! Thank you for allowing me to participate in your care!  Our plans for today:  - I have refilled your muscle relaxant which may help your pain some. - You may take Tylenol and Tramadol for you headache. - I have placed a referral to the pain medicine doctors to discuss spinal injections. - Please call your Neurosurgeon if you headache continues.   Please arrive 15 minutes PRIOR to your next scheduled appointment time! If you do not, this affects OTHER patients' care.  Take care and seek immediate care sooner if you develop any concerns.   Celine Mans, MD, PGY-2 [facility] Family Medicine 2:46 PM 04/07/2023  [facility] Family [facility]

## 2023-04-19 DIAGNOSIS — Z6832 Body mass index (BMI) 32.0-32.9, adult: Secondary | ICD-10-CM | POA: Diagnosis not present

## 2023-04-19 DIAGNOSIS — M4722 Other spondylosis with radiculopathy, cervical region: Secondary | ICD-10-CM | POA: Diagnosis not present

## 2023-04-22 DIAGNOSIS — G5602 Carpal tunnel syndrome, left upper limb: Secondary | ICD-10-CM | POA: Diagnosis not present

## 2023-04-22 DIAGNOSIS — G5622 Lesion of ulnar nerve, left upper limb: Secondary | ICD-10-CM | POA: Diagnosis not present

## 2023-05-10 DIAGNOSIS — Z6832 Body mass index (BMI) 32.0-32.9, adult: Secondary | ICD-10-CM | POA: Diagnosis not present

## 2023-05-10 DIAGNOSIS — M4722 Other spondylosis with radiculopathy, cervical region: Secondary | ICD-10-CM | POA: Diagnosis not present

## 2023-06-03 ENCOUNTER — Other Ambulatory Visit: Payer: Self-pay | Admitting: Family Medicine

## 2023-06-03 DIAGNOSIS — G43909 Migraine, unspecified, not intractable, without status migrainosus: Secondary | ICD-10-CM

## 2023-06-03 DIAGNOSIS — E7800 Pure hypercholesterolemia, unspecified: Secondary | ICD-10-CM

## 2023-06-05 ENCOUNTER — Other Ambulatory Visit: Payer: Self-pay | Admitting: Family Medicine

## 2023-09-03 ENCOUNTER — Other Ambulatory Visit: Payer: Self-pay | Admitting: Student

## 2023-09-03 DIAGNOSIS — E7800 Pure hypercholesterolemia, unspecified: Secondary | ICD-10-CM

## 2023-09-03 DIAGNOSIS — G43909 Migraine, unspecified, not intractable, without status migrainosus: Secondary | ICD-10-CM

## 2023-10-11 ENCOUNTER — Encounter: Payer: Self-pay | Admitting: *Deleted

## 2023-11-29 ENCOUNTER — Other Ambulatory Visit: Payer: Self-pay | Admitting: *Deleted

## 2023-11-29 DIAGNOSIS — G43909 Migraine, unspecified, not intractable, without status migrainosus: Secondary | ICD-10-CM

## 2023-11-29 DIAGNOSIS — E7800 Pure hypercholesterolemia, unspecified: Secondary | ICD-10-CM

## 2023-11-30 MED ORDER — AMITRIPTYLINE HCL 100 MG PO TABS: 100.0000 mg | ORAL_TABLET | Freq: Every day | ORAL | 0 refills | Status: DC

## 2023-11-30 MED ORDER — SIMVASTATIN 40 MG PO TABS: 40.0000 mg | ORAL_TABLET | Freq: Every day | ORAL | 0 refills | Status: DC

## 2023-12-09 ENCOUNTER — Ambulatory Visit (INDEPENDENT_AMBULATORY_CARE_PROVIDER_SITE_OTHER): Admitting: Family Medicine

## 2023-12-09 VITALS — BP 129/82 | HR 83 | Ht 61.0 in | Wt 170.2 lb

## 2023-12-09 DIAGNOSIS — R42 Dizziness and giddiness: Secondary | ICD-10-CM | POA: Diagnosis not present

## 2023-12-09 DIAGNOSIS — G43909 Migraine, unspecified, not intractable, without status migrainosus: Secondary | ICD-10-CM | POA: Diagnosis not present

## 2023-12-09 DIAGNOSIS — M62838 Other muscle spasm: Secondary | ICD-10-CM

## 2023-12-09 MED ORDER — SUMATRIPTAN SUCCINATE 100 MG PO TABS: 100.0000 mg | ORAL_TABLET | ORAL | 0 refills | Status: DC | PRN

## 2023-12-09 MED ORDER — TIZANIDINE HCL 4 MG PO TABS: 4.0000 mg | ORAL_TABLET | Freq: Three times a day (TID) | ORAL | 0 refills | Status: DC | PRN

## 2023-12-09 NOTE — Patient Instructions (Signed)
 You can use tizanidine  as needed for neck pain You can use sumatriptan  as needed if you have a migraine  You should get a call soon to schedule an appointment with vestibular rehab which I think might help with your dizziness  Please seek medical attention if you experience any numbness or weakness in your arms or legs, passing out, facial droop, difficulty speaking or swallowing, chest pain or shortness of breath

## 2023-12-09 NOTE — Assessment & Plan Note (Signed)
 Sent refill of sumatriptan  which patient has not used in a while but would like to try again for migraines

## 2023-12-09 NOTE — Progress Notes (Signed)
    SUBJECTIVE:   CHIEF COMPLAINT / HPI:   Dizziness and lightheadedness x 2 days Also having intermittent neck spasm/pains Her dizzy spells are intermittent and usually associated with bending over or certain positions Her primary concern is to make sure that she is not having a stroke as she had a scare last year but was negative for stroke at that time She denies any numbness or weakness in her extremities.  Denies any ataxia, nausea/vomiting/abdominal pain.  Denies recent fevers.  Denies chest pain, shortness of breath, palpitations Also with chronic migraines for which she takes amitriptyline   PERTINENT  PMH / PSH: History of migraines  OBJECTIVE:   BP 129/82   Pulse 83   Ht 5' 1 (1.549 m)   Wt 170 lb 3.2 oz (77.2 kg)   SpO2 96%   BMI 32.16 kg/m   General: NAD, pleasant, able to participate in exam Cardiac: RRR, no murmurs auscultated Respiratory: CTAB, normal WOB Abdomen: soft, non-tender, non-distended, normoactive bowel sounds Extremities: warm and well perfused, no edema or cyanosis Skin: warm and dry, no rashes noted Neuro: alert, no obvious focal deficits, speech normal.  Cranial nerves II through XII intact without any focal deficits.  Strength 5/5 in bilateral upper and lower extremities.  Sensation intact bilateral upper and lower extremities.  Gait normal.  Finger-nose testing and rapid alternating movements normal.  Attempted Dix-Hallpike maneuver and noted symptom onset and a few beats of nystagmus Psych: Normal affect and mood  ASSESSMENT/PLAN:   Assessment & Plan Dizziness Suspect BPPV Low suspicion for other neurologic or intracranial cause, normal neuroexam, she is not ataxic and has no cerebellar dysfunction on exam. Low suspicion for any cardiac etiology of arrhythmia given lack of cardiac symptoms or syncope Discussed, placed referral for vestibular rehab and discussed return precautions.  Patient agrees to avoid starting meclizine at this time Neck  muscle spasm Can use tizanidine  as needed, discussed avoiding this as able which patient agrees to Migraine without status migrainosus, not intractable, unspecified migraine type Sent refill of sumatriptan  which patient has not used in a while but would like to try again for migraines   Payton Coward, MD [facility]

## 2023-12-26 ENCOUNTER — Ambulatory Visit (INDEPENDENT_AMBULATORY_CARE_PROVIDER_SITE_OTHER): Payer: No Typology Code available for payment source

## 2023-12-26 VITALS — Ht 61.0 in | Wt 170.0 lb

## 2023-12-26 DIAGNOSIS — Z0000 Encounter for general adult medical examination without abnormal findings: Secondary | ICD-10-CM

## 2023-12-26 NOTE — Patient Instructions (Signed)
 Ms. Kristi Evans , Thank you for taking time out of your busy schedule to complete your Annual Wellness Visit with me. I enjoyed our conversation and look forward to speaking with you again next year. I, as well as your care team,  appreciate your ongoing commitment to your health goals. Please review the following plan we discussed and let me know if I can assist you in the future. Your Game plan/ To Do List    Referrals: If you haven't heard from the office you've been referred to, please reach out to them at the phone provided.   Follow up Visits: We will see or speak with you next year for your Next Medicare AWV with our clinical staff Have you seen your provider in the last 6 months (3 months if uncontrolled diabetes)? Yes  Clinician Recommendations:  Aim for 30 minutes of exercise or brisk walking, 6-8 glasses of water, and 5 servings of fruits and vegetables each day.       This is a list of the screenings recommended for you:  Health Maintenance  Topic Date Due   Zoster (Shingles) Vaccine (1 of 2) Never done   Pneumococcal Vaccine for age over 86 (1 of 1 - PCV) Never done   COVID-19 Vaccine (3 - Pfizer risk series) 11/07/2019   DTaP/Tdap/Td vaccine (2 - Td or Tdap) 07/05/2021   Mammogram  02/26/2023   Flu Shot  12/02/2023   Colon Cancer Screening  07/15/2024   Medicare Annual Wellness Visit  12/25/2024   DEXA scan (bone density measurement)  Completed   Hepatitis C Screening  Completed   HPV Vaccine  Aged Out   Meningitis B Vaccine  Aged Out    Advanced directives: (Declined) Advance directive discussed with you today. Even though you declined this today, please call our office should you change your mind, and we can give you the proper paperwork for you to fill out. Advance Care Planning is important because it:  [x]  Makes sure you receive the medical care that is consistent with your values, goals, and preferences  [x]  It provides guidance to your family and loved ones and  reduces their decisional burden about whether or not they are making the right decisions based on your wishes.  Follow the link provided in your after visit summary or read over the paperwork we have mailed to you to help you started getting your Advance Directives in place. If you need assistance in completing these, please reach out to us  so that we can help you!  See attachments for Preventive Care and Fall Prevention Tips.

## 2023-12-26 NOTE — Progress Notes (Signed)
 Because this visit was a virtual/telehealth visit,  certain criteria was not obtained, such a blood pressure, CBG if applicable, and timed get up and go. Any medications not marked as taking were not mentioned during the medication reconciliation part of the visit. Any vitals not documented were not able to be obtained due to this being a telehealth visit or patient was unable to self-report a recent blood pressure reading due to a lack of equipment at home via telehealth. Vitals that have been documented are verbally provided by the patient.   Subjective:   SUPRENA Evans is a 68 y.o. who presents for a Medicare Wellness preventive visit.  As a reminder, Annual Wellness Visits don't include a physical exam, and some assessments may be limited, especially if this visit is performed virtually. We may recommend an in-person follow-up visit with your provider if needed.  Visit Complete: Virtual I connected with  Kristi Evans on 12/26/23 by a audio enabled telemedicine application and verified that I am speaking with the correct person using two identifiers.  Patient Location: Other:  car  Provider Location: Home Office  I discussed the limitations of evaluation and management by telemedicine. The patient expressed understanding and agreed to proceed.  Vital Signs: Because this visit was a virtual/telehealth visit, some criteria may be missing or patient reported. Any vitals not documented were not able to be obtained and vitals that have been documented are patient reported.  VideoDeclined- This patient declined Librarian, academic. Therefore the visit was completed with audio only.  Persons Participating in Visit: Patient.  AWV Questionnaire: No: Patient Medicare AWV questionnaire was not completed prior to this visit.  Cardiac Risk Factors include: advanced age (>50men, >46 women);obesity (BMI >30kg/m2);family history of premature cardiovascular  disease;dyslipidemia     Objective:    Today's Vitals   12/26/23 1504  Weight: 170 lb (77.1 kg)  Height: 5' 1 (1.549 m)  PainSc: 5   PainLoc: Back   Body mass index is 32.12 kg/m.     12/26/2023    3:08 PM 04/04/2023    6:33 AM 03/11/2023    3:10 PM 12/20/2022    6:07 PM 06/18/2020    9:32 AM 12/11/2019   11:10 AM 02/16/2017    9:46 AM  Advanced Directives  Does Patient Have a Medical Advance Directive? No No No No No No No   Would patient like information on creating a medical advance directive? No - Patient declined Yes (MAU/Ambulatory/Procedural Areas - Information given)  Yes (MAU/Ambulatory/Procedural Areas - Information given) No - Patient declined No - Patient declined No - Patient declined      Data saved with a previous flowsheet row definition    Current Medications (verified) Outpatient Encounter Medications as of 12/26/2023  Medication Sig   amitriptyline  (ELAVIL ) 100 MG tablet Take 1 tablet (100 mg total) by mouth at bedtime.   methylPREDNISolone  (MEDROL  DOSEPAK) 4 MG TBPK tablet As directed   Multiple Vitamin (MULTIVITAMIN WITH MINERALS) TABS tablet Take 1 tablet by mouth daily.   omeprazole  (PRILOSEC) 20 MG capsule TAKE 1 CAPSULE TWICE DAILY   simvastatin  (ZOCOR ) 40 MG tablet Take 1 tablet (40 mg total) by mouth daily.   SUMAtriptan  (IMITREX ) 100 MG tablet Take 1 tablet (100 mg total) by mouth every 2 (two) hours as needed for migraine. May repeat in 2 hours if headache persists or recurs.   tiZANidine  (ZANAFLEX ) 4 MG tablet Take 1 tablet (4 mg total) by mouth every 8 (  eight) hours as needed for muscle spasms.   traMADol  (ULTRAM ) 50 MG tablet TAKE 1 TABLET BY MOUTH EVERY 6 HOURS AS NEEDED   No facility-administered encounter medications on file as of 12/26/2023.    Allergies (verified) Aspirin and Codeine phosphate   History: Past Medical History:  Diagnosis Date   GERD (gastroesophageal reflux disease)    Hyperlipidemia    Past Surgical History:   Procedure Laterality Date   ABDOMINAL HYSTERECTOMY  2007   ANKLE SURGERY     COLONOSCOPY  2004   UPPER GASTROINTESTINAL ENDOSCOPY  2004   Family History  Problem Relation Age of Onset   Stroke Mother    Heart disease Father    Heart disease Maternal Grandmother    Heart disease Maternal Grandfather    Heart disease Paternal Grandmother    Heart disease Paternal Grandfather    Colon cancer Neg Hx    Social History   Socioeconomic History   Marital status: Married    Spouse name: Not on file   Number of children: Not on file   Years of education: Not on file   Highest education level: Not on file  Occupational History   Not on file  Tobacco Use   Smoking status: Former    Current packs/day: 0.00    Types: Cigarettes    Quit date: 10/21/2007    Years since quitting: 16.1   Smokeless tobacco: Never  Substance and Sexual Activity   Alcohol use: No    Alcohol/week: 0.0 standard drinks of alcohol   Drug use: No   Sexual activity: Yes    Partners: Male  Other Topics Concern   Not on file  Social History Narrative      Social History:      [name] - husband - would speak for her         [name] 86, [name] 79 daughters;       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; Grandson 2002    Social Drivers of Corporate investment banker Strain: Low Risk  (12/26/2023)   Overall Financial Resource Strain (CARDIA)    Difficulty of Paying Living Expenses: Not hard at all  Food Insecurity: No Food Insecurity (12/26/2023)   Hunger Vital Sign    Worried About Running Out of Food in the Last Year: Never true    Ran Out of Food in the Last Year: Never true  Transportation Needs: No Transportation Needs (12/26/2023)   PRAPARE - Administrator, Civil Service (Medical): No    Lack of Transportation (Non-Medical): No  Physical Activity: Sufficiently Active (12/26/2023)   Exercise Vital Sign    Days of Exercise per Week: 5 days    Minutes of Exercise per Session: 30 min  Stress: No Stress  Concern Present (12/26/2023)   [employer] of Occupational Health - Occupational Stress Questionnaire    Feeling of Stress: Not at all  Social Connections: Moderately Integrated (12/26/2023)   Social Connection and Isolation Panel    Frequency of Communication with Friends and Family: More than three times a week    Frequency of Social Gatherings with Friends and Family: Three times a week    Attends Religious Services: More than 4 times per year    Active Member of Clubs or Organizations: No    Attends Banker Meetings: Never    Marital Status: Married    Tobacco Counseling Counseling given: Not Answered    Clinical Intake:  Pre-visit preparation completed: Yes  Pain : 0-10 Pain Score: 5  Pain Type: Chronic pain Pain Location: Back Pain Orientation: Lower     BMI - recorded: 32.12 Nutritional Risks: None Diabetes: No  No results found for: HGBA1C   How often do you need to have someone help you when you read instructions, pamphlets, or other written materials from your doctor or pharmacy?: 1 - Never What is the last grade level you completed in school?: HSG  Interpreter Needed?: No  Information entered by :: Roz Blanks, LPN.   Activities of Daily Living     12/26/2023    3:08 PM  In your present state of health, do you have any difficulty performing the following activities:  Hearing? 0  Vision? 0  Difficulty concentrating or making decisions? 0  Comment BSE: GAMES, READING, CRAFTS, ETC.  Walking or climbing stairs? 0  Dressing or bathing? 0  Doing errands, shopping? 0  Preparing Food and eating ? N  Using the Toilet? N  In the past six months, have you accidently leaked urine? N  Do you have problems with loss of bowel control? N  Managing your Medications? N  Managing your Finances? N  Housekeeping or managing your Housekeeping? N    Patient Care Team: Lonnie Earnest, MD as PCP - General (Family Medicine) [place], [place], [place]  as Referring Physician (Optometry)  I have updated your Care Teams any recent Medical Services you may have received from other providers in the past year.     Assessment:   This is a routine wellness examination for Kristi Evans.  Hearing/Vision screen Hearing Screening - Comments:: Adequate hearing. Vision Screening - Comments:: Adequate vision, wears eyeglasses.  Up to date with annual eye exam with [facility].   Goals Addressed             This Visit's Progress    12/26/2023: Not to fall.         Depression Screen     12/09/2023    9:18 AM 03/11/2023    2:10 PM 12/20/2022    6:04 PM 02/10/2022    2:15 PM 11/24/2021    9:41 AM 02/25/2021    8:40 AM 06/18/2020    9:31 AM  PHQ 2/9 Scores  PHQ - 2 Score 0 0 0 0 0 1 3  PHQ- 9 Score 2 1  3 4 3 7     Fall Risk     12/26/2023    3:08 PM 12/09/2023    9:18 AM 03/11/2023    2:10 PM 12/20/2022    6:05 PM 06/18/2020    9:31 AM  Fall Risk   Falls in the past year? 0 0 0 0 0  Number falls in past yr: 0 0 0 0 0  Injury with Fall? 0 0 0 0 0  Risk for fall due to : No Fall Risks   No Fall Risks   Follow up Falls evaluation completed   Falls prevention discussed;Education provided;Falls evaluation completed     MEDICARE RISK AT HOME:  Medicare Risk at Home Any stairs in or around the home?: No If so, are there any without handrails?: No Home free of loose throw rugs in walkways, pet beds, electrical cords, etc?: Yes Adequate lighting in your home to reduce risk of falls?: Yes Life alert?: No Use of a cane, walker or w/c?: No Grab bars in the bathroom?: No Shower chair or bench in shower?: No Elevated toilet seat or a handicapped toilet?: No  TIMED UP  AND GO:  Was the test performed?  No  Cognitive Function: Declined/Normal: No cognitive concerns noted by patient or family. Patient alert, oriented, able to answer questions appropriately and recall recent events. No signs of memory loss or confusion.    12/26/2023    3:10 PM   MMSE - Mini Mental State Exam  Not completed: Unable to complete        12/26/2023    3:12 PM 12/20/2022    6:06 PM  6CIT Screen  What Year? 0 points 0 points  What month? 0 points 0 points  What time? 0 points 0 points  Count back from 20 0 points 0 points  Months in reverse 0 points 0 points  Repeat phrase 0 points 0 points  Total Score 0 points 0 points    Immunizations Immunization History  Administered Date(s) Administered   Fluad Quad(high Dose 65+) 02/10/2022   Influenza Split 07/06/2011, 02/14/2012   Influenza Whole 02/25/2010   Influenza, Quadrivalent, Recombinant, Inj, Pf 03/30/2019   Influenza,inj,Quad PF,6+ Mos 02/26/2015, 02/03/2016, 02/16/2017, 02/25/2021   PFIZER(Purple Top)SARS-COV-2 Vaccination 09/19/2019, 10/10/2019   Tdap 07/06/2011    Screening Tests Health Maintenance  Topic Date Due   Zoster Vaccines- Shingrix (1 of 2) Never done   Pneumococcal Vaccine: 50+ Years (1 of 1 - PCV) Never done   COVID-19 Vaccine (3 - Pfizer risk series) 11/07/2019   DTaP/Tdap/Td (2 - Td or Tdap) 07/05/2021   MAMMOGRAM  02/26/2023   INFLUENZA VACCINE  12/02/2023   Colonoscopy  07/15/2024   Medicare Annual Wellness (AWV)  12/25/2024   DEXA SCAN  Completed   Hepatitis C Screening  Completed   HPV VACCINES  Aged Out   Meningococcal B Vaccine  Aged Out    Health Maintenance  Health Maintenance Due  Topic Date Due   Zoster Vaccines- Shingrix (1 of 2) Never done   Pneumococcal Vaccine: 50+ Years (1 of 1 - PCV) Never done   COVID-19 Vaccine (3 - Pfizer risk series) 11/07/2019   DTaP/Tdap/Td (2 - Td or Tdap) 07/05/2021   MAMMOGRAM  02/26/2023   INFLUENZA VACCINE  12/02/2023   Health Maintenance Items Addressed: Yes Patient aware of current care gaps. Patient is overdue for vaccines and Mammogram.    Additional Screening:  Vision Screening: Recommended annual ophthalmology exams for early detection of glaucoma and other disorders of the eye. Would you like a  referral to an eye doctor? No    Dental Screening: Recommended annual dental exams for proper oral hygiene  Community Resource Referral / Chronic Care Management: CRR required this visit?  No   CCM required this visit?  No   Plan:    I have personally reviewed and noted the following in the patient's chart:   Medical and social history Use of alcohol, tobacco or illicit drugs  Current medications and supplements including opioid prescriptions. Patient is not currently taking opioid prescriptions. Functional ability and status Nutritional status Physical activity Advanced directives List of other physicians Hospitalizations, surgeries, and ER visits in previous 12 months Vitals Screenings to include cognitive, depression, and falls Referrals and appointments  In addition, I have reviewed and discussed with patient certain preventive protocols, quality metrics, and best practice recommendations. A written personalized care plan for preventive services as well as general preventive health recommendations were provided to patient.   Roz LOISE Pietro, LPN   [id number]   After Visit Summary: (MyChart) Due to this being a telephonic visit, the after visit summary with patients personalized  plan was offered to patient via MyChart   Notes: Patient aware of current care gaps. Patient is overdue for vaccines and Mammogram. Patient will call to schedule Mammogram.

## 2024-01-25 DIAGNOSIS — F3341 Major depressive disorder, recurrent, in partial remission: Secondary | ICD-10-CM | POA: Diagnosis not present

## 2024-03-03 ENCOUNTER — Other Ambulatory Visit: Payer: Self-pay | Admitting: Family Medicine

## 2024-03-03 DIAGNOSIS — G43909 Migraine, unspecified, not intractable, without status migrainosus: Secondary | ICD-10-CM

## 2024-03-03 DIAGNOSIS — E7800 Pure hypercholesterolemia, unspecified: Secondary | ICD-10-CM

## 2024-03-16 ENCOUNTER — Ambulatory Visit (INDEPENDENT_AMBULATORY_CARE_PROVIDER_SITE_OTHER)

## 2024-03-16 ENCOUNTER — Ambulatory Visit: Payer: Self-pay

## 2024-03-16 VITALS — BP 139/88 | HR 109 | Ht 61.0 in | Wt 171.2 lb

## 2024-03-16 DIAGNOSIS — M62838 Other muscle spasm: Secondary | ICD-10-CM

## 2024-03-16 DIAGNOSIS — E7800 Pure hypercholesterolemia, unspecified: Secondary | ICD-10-CM | POA: Diagnosis not present

## 2024-03-16 DIAGNOSIS — E669 Obesity, unspecified: Secondary | ICD-10-CM

## 2024-03-16 DIAGNOSIS — G43909 Migraine, unspecified, not intractable, without status migrainosus: Secondary | ICD-10-CM

## 2024-03-16 DIAGNOSIS — Z1231 Encounter for screening mammogram for malignant neoplasm of breast: Secondary | ICD-10-CM

## 2024-03-16 LAB — POCT GLYCOSYLATED HEMOGLOBIN (HGB A1C): Hemoglobin A1C: 6.1 % — AB (ref 4.0–5.6)

## 2024-03-16 MED ORDER — AMITRIPTYLINE HCL 100 MG PO TABS: 100.0000 mg | ORAL_TABLET | Freq: Every day | ORAL | 6 refills | Status: AC

## 2024-03-16 MED ORDER — OMEPRAZOLE 20 MG PO CPDR: 20.0000 mg | DELAYED_RELEASE_CAPSULE | Freq: Two times a day (BID) | ORAL | 11 refills | Status: AC

## 2024-03-16 MED ORDER — SIMVASTATIN 40 MG PO TABS: 40.0000 mg | ORAL_TABLET | Freq: Every day | ORAL | 6 refills | Status: AC

## 2024-03-16 MED ORDER — SUMATRIPTAN SUCCINATE 100 MG PO TABS: 100.0000 mg | ORAL_TABLET | ORAL | 3 refills | Status: AC | PRN

## 2024-03-16 MED ORDER — TIZANIDINE HCL 4 MG PO TABS: 4.0000 mg | ORAL_TABLET | Freq: Three times a day (TID) | ORAL | 0 refills | Status: AC | PRN

## 2024-03-16 NOTE — Patient Instructions (Addendum)
   It was good to see you today.   Please bring ALL of your medications with you to every visit.    Today we talked about:  We ordered a screening mammogram for you today. You can call [facility] at [phone] to schedule this.    Thank you for choosing [facility]. Please refer to your mychart for specifics regarding today's visit or future appointments.

## 2024-03-16 NOTE — Progress Notes (Signed)
    SUBJECTIVE:   CHIEF COMPLAINT / HPI:   Migraines - elavil , imitrx as needed - Has consistent migraines but states she only has knock out symptoms a few times per year. Imitrex  usually resolves symptoms  HLD - simvastatin  40mg  daily  Weight Management - reports increased weight gain, trouble losing weight -  Diet Recall: stuffed peppers for dinner, grilled cheese for lunch, bbq chips snack, made No bake cookies but did not eat them. Prefers salty. Has been drinking arnold palmers in a bottle instead of soda. Drinks coffee with creamer, no sugar - Reports she is active and walks a lot.   PERTINENT  PMH / PSH: Obesity, HLD, Migraines  OBJECTIVE:   BP 139/88   Pulse (!) 109   Ht 5' 1 (1.549 m)   Wt 171 lb 3.2 oz (77.7 kg)   SpO2 98%   BMI 32.35 kg/m   Physical Exam General: Alert, conversant, cooperative. No acute distress.  HEENT: PERRL. EOMI. MMM.  Cardiovascular: RRR Respiratory: Lungs CTAB. Normal work of breathing. Abdomen: Non distended Extremities: No cyanosis. No edema Musculoskeletal: No gross deformities.  Skin: Warm. Dry. No rashes. No icterus.  Neurologic: No focal deficits. Moving all extremities. Psychiatric: Cooperative. Appropriate mood. Appropriate affect.   ASSESSMENT/PLAN:   Assessment & Plan Migraine without status migrainosus, not intractable, unspecified migraine type - stable, refilled meds HYPERCHOLESTEROLEMIA - stable, repeat lipids Neck muscle spasm - stable, refilled meds Encounter for screening mammogram for malignant neoplasm of breast - mammogram ordered Obesity (BMI 35.0-39.9 without comorbidity) - Discussed diet with areas of change. She does not eat a large amount of food per day but does say she snacks, and this may be adding more calories than she realizes. We also discussed switching to unsweetened drinks. Will make these small changes and FU for continued weight management. Lipids and A1c ordered today     Kristi LITTIE Deed, MD [facility]

## 2024-03-16 NOTE — Assessment & Plan Note (Signed)
-   stable, repeat lipids

## 2024-03-16 NOTE — Assessment & Plan Note (Signed)
stable, refilled meds

## 2024-03-17 LAB — BASIC METABOLIC PANEL WITH GFR
BUN/Creatinine Ratio: 18 (ref 12–28)
BUN: 17 mg/dL (ref 8–27)
CO2: 23 mmol/L (ref 20–29)
Calcium: 10.1 mg/dL (ref 8.7–10.3)
Chloride: 103 mmol/L (ref 96–106)
Creatinine, Ser: 0.97 mg/dL (ref 0.57–1.00)
Glucose: 126 mg/dL — ABNORMAL HIGH (ref 70–99)
Potassium: 4.8 mmol/L (ref 3.5–5.2)
Sodium: 140 mmol/L (ref 134–144)
eGFR: 64 mL/min/1.73 (ref >59)

## 2024-03-17 LAB — LIPID PANEL
Chol/HDL Ratio: 2.9 ratio (ref 0.0–4.4)
Cholesterol, Total: 203 mg/dL — ABNORMAL HIGH (ref 100–199)
HDL: 71 mg/dL (ref >39)
LDL Chol Calc (NIH): 110 mg/dL — ABNORMAL HIGH (ref 0–99)
Triglycerides: 124 mg/dL (ref 0–149)
VLDL Cholesterol Cal: 22 mg/dL (ref 5–40)

## 2024-05-31 ENCOUNTER — Other Ambulatory Visit (HOSPITAL_COMMUNITY): Payer: Self-pay
